# Patient Record
Sex: Male | Born: 1940 | Race: Black or African American | Marital: Married | Smoking: Never smoker
Health system: Southern US, Community
[De-identification: ages and names within clinical notes are randomized; demographics above are authoritative.]

## PROBLEM LIST (undated history)

## (undated) DIAGNOSIS — M109 Gout, unspecified: Secondary | ICD-10-CM

## (undated) DIAGNOSIS — I639 Cerebral infarction, unspecified: Secondary | ICD-10-CM

## (undated) DIAGNOSIS — Z87442 Personal history of urinary calculi: Secondary | ICD-10-CM

## (undated) DIAGNOSIS — Q614 Renal dysplasia: Secondary | ICD-10-CM

## (undated) DIAGNOSIS — N189 Chronic kidney disease, unspecified: Secondary | ICD-10-CM

## (undated) DIAGNOSIS — E119 Type 2 diabetes mellitus without complications: Secondary | ICD-10-CM

## (undated) DIAGNOSIS — I10 Essential (primary) hypertension: Secondary | ICD-10-CM

## (undated) HISTORY — PX: LITHOTRIPSY: SUR834

---

## 2016-01-14 ENCOUNTER — Other Ambulatory Visit: Payer: Self-pay | Admitting: Urology

## 2016-01-14 DIAGNOSIS — N281 Cyst of kidney, acquired: Secondary | ICD-10-CM

## 2016-01-22 ENCOUNTER — Ambulatory Visit
Admission: RE | Admit: 2016-01-22 | Discharge: 2016-01-22 | Disposition: A | Discharge status: Home / Self Care | Payer: Medicare PPO | Source: Ambulatory Visit | Attending: Urology | Admitting: Urology

## 2016-01-22 DIAGNOSIS — N281 Cyst of kidney, acquired: Secondary | ICD-10-CM

## 2016-01-22 MED ORDER — GADOBENATE DIMEGLUMINE 529 MG/ML IV SOLN
20.0000 mL | Freq: Once | INTRAVENOUS | Status: AC | PRN
  Administered 2016-01-22: 20 mL via INTRAVENOUS

## 2016-08-22 DIAGNOSIS — M1009 Idiopathic gout, multiple sites: Secondary | ICD-10-CM | POA: Diagnosis not present

## 2016-08-22 DIAGNOSIS — B9689 Other specified bacterial agents as the cause of diseases classified elsewhere: Secondary | ICD-10-CM | POA: Diagnosis not present

## 2016-08-22 DIAGNOSIS — R69 Illness, unspecified: Secondary | ICD-10-CM | POA: Diagnosis not present

## 2016-08-22 DIAGNOSIS — Z1389 Encounter for screening for other disorder: Secondary | ICD-10-CM | POA: Diagnosis not present

## 2016-08-22 DIAGNOSIS — N182 Chronic kidney disease, stage 2 (mild): Secondary | ICD-10-CM | POA: Diagnosis not present

## 2016-08-22 DIAGNOSIS — I10 Essential (primary) hypertension: Secondary | ICD-10-CM | POA: Diagnosis not present

## 2016-08-22 DIAGNOSIS — Z6834 Body mass index (BMI) 34.0-34.9, adult: Secondary | ICD-10-CM | POA: Diagnosis not present

## 2016-08-22 DIAGNOSIS — Z0000 Encounter for general adult medical examination without abnormal findings: Secondary | ICD-10-CM | POA: Diagnosis not present

## 2016-08-22 DIAGNOSIS — I6789 Other cerebrovascular disease: Secondary | ICD-10-CM | POA: Diagnosis not present

## 2016-08-22 DIAGNOSIS — N4289 Other specified disorders of prostate: Secondary | ICD-10-CM | POA: Diagnosis not present

## 2016-08-22 DIAGNOSIS — E784 Other hyperlipidemia: Secondary | ICD-10-CM | POA: Diagnosis not present

## 2016-09-06 DIAGNOSIS — R3915 Urgency of urination: Secondary | ICD-10-CM | POA: Diagnosis not present

## 2016-09-06 DIAGNOSIS — R351 Nocturia: Secondary | ICD-10-CM | POA: Diagnosis not present

## 2016-11-17 DIAGNOSIS — I10 Essential (primary) hypertension: Secondary | ICD-10-CM | POA: Diagnosis not present

## 2016-11-17 DIAGNOSIS — Z79899 Other long term (current) drug therapy: Secondary | ICD-10-CM | POA: Diagnosis not present

## 2016-11-17 DIAGNOSIS — M810 Age-related osteoporosis without current pathological fracture: Secondary | ICD-10-CM | POA: Diagnosis not present

## 2016-11-17 DIAGNOSIS — Z7982 Long term (current) use of aspirin: Secondary | ICD-10-CM | POA: Diagnosis not present

## 2016-11-17 DIAGNOSIS — M109 Gout, unspecified: Secondary | ICD-10-CM | POA: Diagnosis not present

## 2016-11-17 DIAGNOSIS — N400 Enlarged prostate without lower urinary tract symptoms: Secondary | ICD-10-CM | POA: Diagnosis not present

## 2016-11-17 DIAGNOSIS — Z13820 Encounter for screening for osteoporosis: Secondary | ICD-10-CM | POA: Diagnosis not present

## 2016-11-17 DIAGNOSIS — E7800 Pure hypercholesterolemia, unspecified: Secondary | ICD-10-CM | POA: Diagnosis not present

## 2016-11-29 DIAGNOSIS — N4289 Other specified disorders of prostate: Secondary | ICD-10-CM | POA: Diagnosis not present

## 2016-11-29 DIAGNOSIS — N182 Chronic kidney disease, stage 2 (mild): Secondary | ICD-10-CM | POA: Diagnosis not present

## 2016-11-29 DIAGNOSIS — I6789 Other cerebrovascular disease: Secondary | ICD-10-CM | POA: Diagnosis not present

## 2016-11-29 DIAGNOSIS — E784 Other hyperlipidemia: Secondary | ICD-10-CM | POA: Diagnosis not present

## 2016-11-29 DIAGNOSIS — Z6834 Body mass index (BMI) 34.0-34.9, adult: Secondary | ICD-10-CM | POA: Diagnosis not present

## 2016-11-29 DIAGNOSIS — I10 Essential (primary) hypertension: Secondary | ICD-10-CM | POA: Diagnosis not present

## 2016-11-29 DIAGNOSIS — Z125 Encounter for screening for malignant neoplasm of prostate: Secondary | ICD-10-CM | POA: Diagnosis not present

## 2016-11-29 DIAGNOSIS — M1009 Idiopathic gout, multiple sites: Secondary | ICD-10-CM | POA: Diagnosis not present

## 2017-01-18 ENCOUNTER — Ambulatory Visit: Payer: Medicare PPO | Admitting: Urology

## 2017-03-07 DIAGNOSIS — I6789 Other cerebrovascular disease: Secondary | ICD-10-CM | POA: Diagnosis not present

## 2017-03-07 DIAGNOSIS — N182 Chronic kidney disease, stage 2 (mild): Secondary | ICD-10-CM | POA: Diagnosis not present

## 2017-03-07 DIAGNOSIS — I10 Essential (primary) hypertension: Secondary | ICD-10-CM | POA: Diagnosis not present

## 2017-03-07 DIAGNOSIS — M1009 Idiopathic gout, multiple sites: Secondary | ICD-10-CM | POA: Diagnosis not present

## 2017-03-07 DIAGNOSIS — Z6833 Body mass index (BMI) 33.0-33.9, adult: Secondary | ICD-10-CM | POA: Diagnosis not present

## 2017-03-07 DIAGNOSIS — E1165 Type 2 diabetes mellitus with hyperglycemia: Secondary | ICD-10-CM | POA: Diagnosis not present

## 2017-03-07 DIAGNOSIS — E784 Other hyperlipidemia: Secondary | ICD-10-CM | POA: Diagnosis not present

## 2017-05-03 ENCOUNTER — Ambulatory Visit: Payer: Medicare PPO | Admitting: Urology

## 2017-05-30 DIAGNOSIS — H02403 Unspecified ptosis of bilateral eyelids: Secondary | ICD-10-CM | POA: Diagnosis not present

## 2017-05-30 DIAGNOSIS — H35032 Hypertensive retinopathy, left eye: Secondary | ICD-10-CM | POA: Diagnosis not present

## 2017-05-30 DIAGNOSIS — H524 Presbyopia: Secondary | ICD-10-CM | POA: Diagnosis not present

## 2017-05-30 DIAGNOSIS — H5203 Hypermetropia, bilateral: Secondary | ICD-10-CM | POA: Diagnosis not present

## 2017-05-30 DIAGNOSIS — E119 Type 2 diabetes mellitus without complications: Secondary | ICD-10-CM | POA: Diagnosis not present

## 2017-05-30 DIAGNOSIS — H52222 Regular astigmatism, left eye: Secondary | ICD-10-CM | POA: Diagnosis not present

## 2017-05-30 DIAGNOSIS — I10 Essential (primary) hypertension: Secondary | ICD-10-CM | POA: Diagnosis not present

## 2017-05-30 DIAGNOSIS — H11153 Pinguecula, bilateral: Secondary | ICD-10-CM | POA: Diagnosis not present

## 2017-05-30 DIAGNOSIS — H18413 Arcus senilis, bilateral: Secondary | ICD-10-CM | POA: Diagnosis not present

## 2017-05-30 DIAGNOSIS — H35031 Hypertensive retinopathy, right eye: Secondary | ICD-10-CM | POA: Diagnosis not present

## 2017-06-08 DIAGNOSIS — I6789 Other cerebrovascular disease: Secondary | ICD-10-CM | POA: Diagnosis not present

## 2017-06-08 DIAGNOSIS — E7849 Other hyperlipidemia: Secondary | ICD-10-CM | POA: Diagnosis not present

## 2017-06-08 DIAGNOSIS — N182 Chronic kidney disease, stage 2 (mild): Secondary | ICD-10-CM | POA: Diagnosis not present

## 2017-06-08 DIAGNOSIS — Z6833 Body mass index (BMI) 33.0-33.9, adult: Secondary | ICD-10-CM | POA: Diagnosis not present

## 2017-06-08 DIAGNOSIS — M1009 Idiopathic gout, multiple sites: Secondary | ICD-10-CM | POA: Diagnosis not present

## 2017-06-08 DIAGNOSIS — E1165 Type 2 diabetes mellitus with hyperglycemia: Secondary | ICD-10-CM | POA: Diagnosis not present

## 2017-06-08 DIAGNOSIS — I10 Essential (primary) hypertension: Secondary | ICD-10-CM | POA: Diagnosis not present

## 2017-06-08 DIAGNOSIS — R69 Illness, unspecified: Secondary | ICD-10-CM | POA: Diagnosis not present

## 2017-07-19 DIAGNOSIS — I679 Cerebrovascular disease, unspecified: Secondary | ICD-10-CM | POA: Diagnosis not present

## 2017-07-19 DIAGNOSIS — G4459 Other complicated headache syndrome: Secondary | ICD-10-CM | POA: Diagnosis not present

## 2017-07-19 DIAGNOSIS — I10 Essential (primary) hypertension: Secondary | ICD-10-CM | POA: Diagnosis not present

## 2017-07-19 DIAGNOSIS — M542 Cervicalgia: Secondary | ICD-10-CM | POA: Diagnosis not present

## 2017-07-20 ENCOUNTER — Other Ambulatory Visit (HOSPITAL_COMMUNITY): Payer: Self-pay | Admitting: Neurology

## 2017-07-20 ENCOUNTER — Ambulatory Visit (HOSPITAL_COMMUNITY)
Admission: RE | Admit: 2017-07-20 | Discharge: 2017-07-20 | Disposition: A | Discharge status: Home / Self Care | Payer: Medicare HMO | Source: Ambulatory Visit | Attending: Neurology | Admitting: Neurology

## 2017-07-20 DIAGNOSIS — M542 Cervicalgia: Secondary | ICD-10-CM

## 2017-07-20 DIAGNOSIS — M5030 Other cervical disc degeneration, unspecified cervical region: Secondary | ICD-10-CM | POA: Insufficient documentation

## 2017-08-10 DIAGNOSIS — K5730 Diverticulosis of large intestine without perforation or abscess without bleeding: Secondary | ICD-10-CM | POA: Diagnosis not present

## 2017-08-10 DIAGNOSIS — Z86010 Personal history of colonic polyps: Secondary | ICD-10-CM | POA: Diagnosis not present

## 2017-08-10 DIAGNOSIS — I10 Essential (primary) hypertension: Secondary | ICD-10-CM | POA: Diagnosis not present

## 2017-08-10 DIAGNOSIS — Z8673 Personal history of transient ischemic attack (TIA), and cerebral infarction without residual deficits: Secondary | ICD-10-CM | POA: Diagnosis not present

## 2017-08-10 DIAGNOSIS — E119 Type 2 diabetes mellitus without complications: Secondary | ICD-10-CM | POA: Diagnosis not present

## 2017-08-10 DIAGNOSIS — Z1211 Encounter for screening for malignant neoplasm of colon: Secondary | ICD-10-CM | POA: Diagnosis not present

## 2017-09-19 DIAGNOSIS — E7849 Other hyperlipidemia: Secondary | ICD-10-CM | POA: Diagnosis not present

## 2017-09-19 DIAGNOSIS — E119 Type 2 diabetes mellitus without complications: Secondary | ICD-10-CM | POA: Diagnosis not present

## 2017-09-19 DIAGNOSIS — N182 Chronic kidney disease, stage 2 (mild): Secondary | ICD-10-CM | POA: Diagnosis not present

## 2017-09-19 DIAGNOSIS — Z6833 Body mass index (BMI) 33.0-33.9, adult: Secondary | ICD-10-CM | POA: Diagnosis not present

## 2017-09-19 DIAGNOSIS — I6789 Other cerebrovascular disease: Secondary | ICD-10-CM | POA: Diagnosis not present

## 2017-09-19 DIAGNOSIS — I10 Essential (primary) hypertension: Secondary | ICD-10-CM | POA: Diagnosis not present

## 2017-09-19 DIAGNOSIS — M1009 Idiopathic gout, multiple sites: Secondary | ICD-10-CM | POA: Diagnosis not present

## 2017-09-27 DIAGNOSIS — R69 Illness, unspecified: Secondary | ICD-10-CM | POA: Diagnosis not present

## 2017-09-27 DIAGNOSIS — R32 Unspecified urinary incontinence: Secondary | ICD-10-CM | POA: Diagnosis not present

## 2017-09-27 DIAGNOSIS — N400 Enlarged prostate without lower urinary tract symptoms: Secondary | ICD-10-CM | POA: Diagnosis not present

## 2017-09-27 DIAGNOSIS — I10 Essential (primary) hypertension: Secondary | ICD-10-CM | POA: Diagnosis not present

## 2017-09-27 DIAGNOSIS — E785 Hyperlipidemia, unspecified: Secondary | ICD-10-CM | POA: Diagnosis not present

## 2017-09-27 DIAGNOSIS — E669 Obesity, unspecified: Secondary | ICD-10-CM | POA: Diagnosis not present

## 2017-09-27 DIAGNOSIS — Z6834 Body mass index (BMI) 34.0-34.9, adult: Secondary | ICD-10-CM | POA: Diagnosis not present

## 2017-09-27 DIAGNOSIS — N529 Male erectile dysfunction, unspecified: Secondary | ICD-10-CM | POA: Diagnosis not present

## 2017-09-27 DIAGNOSIS — E119 Type 2 diabetes mellitus without complications: Secondary | ICD-10-CM | POA: Diagnosis not present

## 2017-09-27 DIAGNOSIS — R52 Pain, unspecified: Secondary | ICD-10-CM | POA: Diagnosis not present

## 2017-12-05 ENCOUNTER — Ambulatory Visit: Payer: Medicare HMO | Admitting: Urology

## 2017-12-05 DIAGNOSIS — N401 Enlarged prostate with lower urinary tract symptoms: Secondary | ICD-10-CM

## 2017-12-05 DIAGNOSIS — R351 Nocturia: Secondary | ICD-10-CM | POA: Diagnosis not present

## 2017-12-05 DIAGNOSIS — R3915 Urgency of urination: Secondary | ICD-10-CM

## 2017-12-13 ENCOUNTER — Other Ambulatory Visit: Payer: Self-pay | Admitting: Urology

## 2017-12-14 ENCOUNTER — Other Ambulatory Visit: Payer: Self-pay | Admitting: Urology

## 2017-12-19 ENCOUNTER — Other Ambulatory Visit: Payer: Self-pay | Admitting: Urology

## 2017-12-20 ENCOUNTER — Other Ambulatory Visit: Payer: Self-pay | Admitting: Urology

## 2017-12-20 DIAGNOSIS — N138 Other obstructive and reflux uropathy: Secondary | ICD-10-CM

## 2017-12-20 DIAGNOSIS — N401 Enlarged prostate with lower urinary tract symptoms: Principal | ICD-10-CM

## 2017-12-21 ENCOUNTER — Other Ambulatory Visit: Payer: Self-pay | Admitting: Urology

## 2017-12-21 DIAGNOSIS — N138 Other obstructive and reflux uropathy: Secondary | ICD-10-CM

## 2017-12-21 DIAGNOSIS — N401 Enlarged prostate with lower urinary tract symptoms: Principal | ICD-10-CM

## 2017-12-25 DIAGNOSIS — I6789 Other cerebrovascular disease: Secondary | ICD-10-CM | POA: Diagnosis not present

## 2017-12-25 DIAGNOSIS — E7849 Other hyperlipidemia: Secondary | ICD-10-CM | POA: Diagnosis not present

## 2017-12-25 DIAGNOSIS — N182 Chronic kidney disease, stage 2 (mild): Secondary | ICD-10-CM | POA: Diagnosis not present

## 2017-12-25 DIAGNOSIS — M1009 Idiopathic gout, multiple sites: Secondary | ICD-10-CM | POA: Diagnosis not present

## 2017-12-25 DIAGNOSIS — I10 Essential (primary) hypertension: Secondary | ICD-10-CM | POA: Diagnosis not present

## 2017-12-25 DIAGNOSIS — Z6833 Body mass index (BMI) 33.0-33.9, adult: Secondary | ICD-10-CM | POA: Diagnosis not present

## 2017-12-25 DIAGNOSIS — Z6832 Body mass index (BMI) 32.0-32.9, adult: Secondary | ICD-10-CM | POA: Diagnosis not present

## 2017-12-25 DIAGNOSIS — E119 Type 2 diabetes mellitus without complications: Secondary | ICD-10-CM | POA: Diagnosis not present

## 2017-12-29 ENCOUNTER — Ambulatory Visit (HOSPITAL_COMMUNITY)
Admission: RE | Admit: 2017-12-29 | Discharge: 2017-12-29 | Disposition: A | Discharge status: Home / Self Care | Payer: Medicare HMO | Source: Ambulatory Visit | Attending: Urology | Admitting: Urology

## 2017-12-29 DIAGNOSIS — N401 Enlarged prostate with lower urinary tract symptoms: Secondary | ICD-10-CM | POA: Diagnosis not present

## 2017-12-29 DIAGNOSIS — N138 Other obstructive and reflux uropathy: Secondary | ICD-10-CM

## 2018-01-09 ENCOUNTER — Ambulatory Visit: Payer: Medicare HMO | Admitting: Urology

## 2018-01-09 DIAGNOSIS — R351 Nocturia: Secondary | ICD-10-CM | POA: Diagnosis not present

## 2018-01-09 DIAGNOSIS — N401 Enlarged prostate with lower urinary tract symptoms: Secondary | ICD-10-CM | POA: Diagnosis not present

## 2018-01-15 ENCOUNTER — Other Ambulatory Visit: Payer: Self-pay | Admitting: Urology

## 2018-02-06 NOTE — Patient Instructions (Addendum)
Barry Carpenter  02/06/2018     @PREFPERIOPPHARMACY @   Your procedure is scheduled on  02/15/2018   Report to [facility] at  615   A.M.  Call this number if you have problems the morning of surgery:  [phone]   Remember:  Do not eat or drink after midnight.  You may drink clear liquids until  12 midnight 02/14/2018 .  Clear liquids allowed are:                    Water, Juice (non-citric and without pulp), Carbonated beverages, Clear Tea, Black Coffee only, Plain Jell-O only, Gatorade and Plain Popsicles only    Take these medicines the morning of surgery with A SIP OF WATER Amlodipine, Catapress, Proscar, Lisinopril, Ditropan, Flomax  Do not wear jewelry, make-up or nail polish.  Do not wear lotions, powders, or perfumes, or deodorant.  Do not shave 48 hours prior to surgery.  Men may shave face and neck.  Do not bring valuables to the hospital.  [facility] is not responsible for any belongings or valuables.  Contacts, dentures or bridgework may not be worn into surgery.  Leave your suitcase in the car.  After surgery it may be brought to your room.  For patients admitted to the hospital, discharge time will be determined by your treatment team.  Patients discharged the day of surgery will not be allowed to drive home.   Name and phone number of your driver:   family Special instructions:  None  Please read over the following fact sheets that you were given. Anesthesia Post-op Instructions and Care and Recovery After Surgery       Cystoscopy Cystoscopy is a procedure that is used to help diagnose and sometimes treat conditions that affect that lower urinary tract. The lower urinary tract includes the bladder and the tube that drains urine from the bladder out of the body (urethra). Cystoscopy is performed with a thin, tube-shaped instrument with a light and camera at the end (cystoscope). The cystoscope may be hard (rigid) or flexible, depending on the goal of  the procedure.The cystoscope is inserted through the urethra, into the bladder. Cystoscopy may be recommended if you have:  Urinary tractinfections that keep coming back (recurring).  Blood in the urine (hematuria).  Loss of bladder control (urinary incontinence) or an overactive bladder.  Unusual cells found in a urine sample.  A blockage in the urethra.  Painful urination.  An abnormality in the bladder found during an intravenous pyelogram (IVP) or CT scan.  Cystoscopy may also be done to remove a sample of tissue to be examined under a microscope (biopsy). Tell a health care provider about:  Any allergies you have.  All medicines you are taking, including vitamins, herbs, eye drops, creams, and over-the-counter medicines.  Any problems you or family members have had with anesthetic medicines.  Any blood disorders you have.  Any surgeries you have had.  Any medical conditions you have.  Whether you are pregnant or may be pregnant. What are the risks? Generally, this is a safe procedure. However, problems may occur, including:  Infection.  Bleeding.  Allergic reactions to medicines.  Damage to other structures or organs.  What happens before the procedure?  Ask your health care provider about: ? Changing or stopping your regular medicines. This is especially important if you are taking diabetes medicines or blood thinners. ? Taking medicines such as aspirin  and ibuprofen. These medicines can thin your blood. Do not take these medicines before your procedure if your health care provider instructs you not to.  Follow instructions from your health care provider about eating or drinking restrictions.  You may be given antibiotic medicine to help prevent infection.  You may have an exam or testing, such as X-rays of the bladder, urethra, or kidneys.  You may have urine tests to check for signs of infection.  Plan to have someone take you home after the  procedure. What happens during the procedure?  To reduce your risk of infection,your health care team will wash or sanitize their hands.  You will be given one or more of the following: ? A medicine to help you relax (sedative). ? A medicine to numb the area (local anesthetic).  The area around the opening of your urethra will be cleaned.  The cystoscope will be passed through your urethra into your bladder.  Germ-free (sterile)fluid will flow through the cystoscope to fill your bladder. The fluid will stretch your bladder so that your surgeon can clearly examine your bladder walls.  The cystoscope will be removed and your bladder will be emptied. The procedure may vary among health care providers and hospitals. What happens after the procedure?  You may have some soreness or pain in your abdomen and urethra. Medicines will be available to help you.  You may have some blood in your urine.  Do not drive for 24 hours if you received a sedative. This information is not intended to replace advice given to you by your health care provider. Make sure you discuss any questions you have with your health care provider. Document Released: 07/22/2000 Document Revised: 12/03/2015 Document Reviewed: 06/11/2015 Elsevier Interactive Patient Education  2018 [employer].  Cystoscopy, Care After Refer to this sheet in the next few weeks. These instructions provide you with information about caring for yourself after your procedure. Your health care provider may also give you more specific instructions. Your treatment has been planned according to current medical practices, but problems sometimes occur. Call your health care provider if you have any problems or questions after your procedure. What can I expect after the procedure? After the procedure, it is common to have:  Mild pain when you urinate. Pain should stop within a few minutes after you urinate. This may last for up to 1 week.  A  small amount of blood in your urine for several days.  Feeling like you need to urinate but producing only a small amount of urine.  Follow these instructions at home:  Medicines  Take over-the-counter and prescription medicines only as told by your health care provider.  If you were prescribed an antibiotic medicine, take it as told by your health care provider. Do not stop taking the antibiotic even if you start to feel better. General instructions   Return to your normal activities as told by your health care provider. Ask your health care provider what activities are safe for you.  Do not drive for 24 hours if you received a sedative.  Watch for any blood in your urine. If the amount of blood in your urine increases, call your health care provider.  Follow instructions from your health care provider about eating or drinking restrictions.  If a tissue sample was removed for testing (biopsy) during your procedure, it is your responsibility to get your test results. Ask your health care provider or the department performing the test when your results  will be ready.  Drink enough fluid to keep your urine clear or pale yellow.  Keep all follow-up visits as told by your health care provider. This is important. Contact a health care provider if:  You have pain that gets worse or does not get better with medicine, especially pain when you urinate.  You have difficulty urinating. Get help right away if:  You have more blood in your urine.  You have blood clots in your urine.  You have abdominal pain.  You have a fever or chills.  You are unable to urinate. This information is not intended to replace advice given to you by your health care provider. Make sure you discuss any questions you have with your health care provider. Document Released: 02/11/2005 Document Revised: 12/31/2015 Document Reviewed: 06/11/2015 Elsevier Interactive Patient Education  2018 [employer]  Monitored Anesthesia Care Anesthesia is a term that refers to techniques, procedures, and medicines that help a person stay safe and comfortable during a medical procedure. Monitored anesthesia care, or sedation, is one type of anesthesia. Your anesthesia specialist may recommend sedation if you will be having a procedure that does not require you to be unconscious, such as:  Cataract surgery.  A dental procedure.  A biopsy.  A colonoscopy.  During the procedure, you may receive a medicine to help you relax (sedative). There are three levels of sedation:  Mild sedation. At this level, you may feel awake and relaxed. You will be able to follow directions.  Moderate sedation. At this level, you will be sleepy. You may not remember the procedure.  Deep sedation. At this level, you will be asleep. You will not remember the procedure.  The more medicine you are given, the deeper your level of sedation will be. Depending on how you respond to the procedure, the anesthesia specialist may change your level of sedation or the type of anesthesia to fit your needs. An anesthesia specialist will monitor you closely during the procedure. Let your health care provider know about:  Any allergies you have.  All medicines you are taking, including vitamins, herbs, eye drops, creams, and over-the-counter medicines.  Any use of steroids (by mouth or as a cream).  Any problems you or family members have had with sedatives and anesthetic medicines.  Any blood disorders you have.  Any surgeries you have had.  Any medical conditions you have, such as sleep apnea.  Whether you are pregnant or may be pregnant.  Any use of cigarettes, alcohol, or street drugs. What are the risks? Generally, this is a safe procedure. However, problems may occur, including:  Getting too much medicine (oversedation).  Nausea.  Allergic reaction to medicines.  Trouble breathing. If this happens, a breathing  tube may be used to help with breathing. It will be removed when you are awake and breathing on your own.  Heart trouble.  Lung trouble.  Before the procedure Staying hydrated Follow instructions from your health care provider about hydration, which may include:  Up to 2 hours before the procedure - you may continue to drink clear liquids, such as water, clear fruit juice, black coffee, and plain tea.  Eating and drinking restrictions Follow instructions from your health care provider about eating and drinking, which may include:  8 hours before the procedure - stop eating heavy meals or foods such as meat, fried foods, or fatty foods.  6 hours before the procedure - stop eating light meals or foods, such as toast or cereal.  6 hours before the procedure - stop drinking milk or drinks that contain milk.  2 hours before the procedure - stop drinking clear liquids.  Medicines Ask your health care provider about:  Changing or stopping your regular medicines. This is especially important if you are taking diabetes medicines or blood thinners.  Taking medicines such as aspirin and ibuprofen. These medicines can thin your blood. Do not take these medicines before your procedure if your health care provider instructs you not to.  Tests and exams  You will have a physical exam.  You may have blood tests done to show: ? How well your kidneys and liver are working. ? How well your blood can clot.  General instructions  Plan to have someone take you home from the hospital or clinic.  If you will be going home right after the procedure, plan to have someone with you for 24 hours.  What happens during the procedure?  Your blood pressure, heart rate, breathing, level of pain and overall condition will be monitored.  An IV tube will be inserted into one of your veins.  Your anesthesia specialist will give you medicines as needed to keep you comfortable during the procedure. This  may mean changing the level of sedation.  The procedure will be performed. After the procedure  Your blood pressure, heart rate, breathing rate, and blood oxygen level will be monitored until the medicines you were given have worn off.  Do not drive for 24 hours if you received a sedative.  You may: ? Feel sleepy, clumsy, or nauseous. ? Feel forgetful about what happened after the procedure. ? Have a sore throat if you had a breathing tube during the procedure. ? Vomit. This information is not intended to replace advice given to you by your health care provider. Make sure you discuss any questions you have with your health care provider. Document Released: 04/20/2005 Document Revised: 01/01/2016 Document Reviewed: 11/15/2015 Elsevier Interactive Patient Education  2018 [employer] Inc[employer] Monitored Anesthesia Care, Care After These instructions provide you with information about caring for yourself after your procedure. Your health care provider may also give you more specific instructions. Your treatment has been planned according to current medical practices, but problems sometimes occur. Call your health care provider if you have any problems or questions after your procedure. What can I expect after the procedure? After your procedure, it is common to:  Feel sleepy for several hours.  Feel clumsy and have poor balance for several hours.  Feel forgetful about what happened after the procedure.  Have poor judgment for several hours.  Feel nauseous or vomit.  Have a sore throat if you had a breathing tube during the procedure.  Follow these instructions at home: For at least 24 hours after the procedure:   Do not: ? Participate in activities in which you could fall or become injured. ? Drive. ? Use heavy machinery. ? Drink alcohol. ? Take sleeping pills or medicines that cause drowsiness. ? Make important decisions or sign legal documents. ? Take care of children on your  own.  Rest. Eating and drinking  Follow the diet that is recommended by your health care provider.  If you vomit, drink water, juice, or soup when you can drink without vomiting.  Make sure you have little or no nausea before eating solid foods. General instructions  Have a responsible adult stay with you until you are awake and alert.  Take over-the-counter and prescription medicines only as told by  your health care provider.  If you smoke, do not smoke without supervision.  Keep all follow-up visits as told by your health care provider. This is important. Contact a health care provider if:  You keep feeling nauseous or you keep vomiting.  You feel light-headed.  You develop a rash.  You have a fever. Get help right away if:  You have trouble breathing. This information is not intended to replace advice given to you by your health care provider. Make sure you discuss any questions you have with your health care provider. Document Released: 11/15/2015 Document Revised: 03/16/2016 Document Reviewed: 11/15/2015 Elsevier Interactive Patient Education  [employer].

## 2018-02-12 ENCOUNTER — Encounter (HOSPITAL_COMMUNITY)
Admission: RE | Admit: 2018-02-12 | Discharge: 2018-02-12 | Disposition: A | Discharge status: Home / Self Care | Payer: Medicare HMO | Source: Ambulatory Visit | Attending: Urology | Admitting: Urology

## 2018-02-12 ENCOUNTER — Other Ambulatory Visit: Payer: Self-pay

## 2018-02-12 ENCOUNTER — Encounter (HOSPITAL_COMMUNITY): Payer: Self-pay

## 2018-02-12 DIAGNOSIS — Q614 Renal dysplasia: Secondary | ICD-10-CM | POA: Diagnosis not present

## 2018-02-12 DIAGNOSIS — Z01812 Encounter for preprocedural laboratory examination: Secondary | ICD-10-CM

## 2018-02-12 DIAGNOSIS — N138 Other obstructive and reflux uropathy: Secondary | ICD-10-CM | POA: Diagnosis not present

## 2018-02-12 DIAGNOSIS — I10 Essential (primary) hypertension: Secondary | ICD-10-CM | POA: Diagnosis not present

## 2018-02-12 DIAGNOSIS — Z01810 Encounter for preprocedural cardiovascular examination: Secondary | ICD-10-CM

## 2018-02-12 DIAGNOSIS — M109 Gout, unspecified: Secondary | ICD-10-CM | POA: Diagnosis not present

## 2018-02-12 DIAGNOSIS — N401 Enlarged prostate with lower urinary tract symptoms: Secondary | ICD-10-CM | POA: Diagnosis not present

## 2018-02-12 DIAGNOSIS — Z87442 Personal history of urinary calculi: Secondary | ICD-10-CM | POA: Diagnosis not present

## 2018-02-12 DIAGNOSIS — E119 Type 2 diabetes mellitus without complications: Secondary | ICD-10-CM | POA: Diagnosis not present

## 2018-02-12 HISTORY — DX: Essential (primary) hypertension: I10

## 2018-02-12 HISTORY — DX: Type 2 diabetes mellitus without complications: E11.9

## 2018-02-12 HISTORY — DX: Renal dysplasia: Q61.4

## 2018-02-12 HISTORY — DX: Gout, unspecified: M10.9

## 2018-02-12 HISTORY — DX: Personal history of urinary calculi: Z87.442

## 2018-02-12 LAB — CBC WITH DIFFERENTIAL/PLATELET
BASOS PCT: 0 %
Basophils Absolute: 0 10*3/uL (ref 0.0–0.1)
Eosinophils Absolute: 0.1 10*3/uL (ref 0.0–0.7)
Eosinophils Relative: 2 %
HEMATOCRIT: 46.4 % (ref 39.0–52.0)
Hemoglobin: 16 g/dL (ref 13.0–17.0)
Lymphocytes Relative: 23 %
Lymphs Abs: 1.2 10*3/uL (ref 0.7–4.0)
MCH: 33.5 pg (ref 26.0–34.0)
MCHC: 34.5 g/dL (ref 30.0–36.0)
MCV: 97.1 fL (ref 78.0–100.0)
MONO ABS: 0.5 10*3/uL (ref 0.1–1.0)
MONOS PCT: 9 %
NEUTROS ABS: 3.5 10*3/uL (ref 1.7–7.7)
Neutrophils Relative %: 66 %
Platelets: 148 10*3/uL — ABNORMAL LOW (ref 150–400)
RBC: 4.78 MIL/uL (ref 4.22–5.81)
RDW: 14.5 % (ref 11.5–15.5)
WBC: 5.3 10*3/uL (ref 4.0–10.5)

## 2018-02-12 LAB — BASIC METABOLIC PANEL
Anion gap: 10 (ref 5–15)
BUN: 35 mg/dL — ABNORMAL HIGH (ref 8–23)
CALCIUM: 9.4 mg/dL (ref 8.9–10.3)
CO2: 22 mmol/L (ref 22–32)
CREATININE: 1.73 mg/dL — AB (ref 0.61–1.24)
Chloride: 106 mmol/L (ref 98–111)
GFR calc Af Amer: 43 mL/min — ABNORMAL LOW (ref >60)
GFR calc non Af Amer: 37 mL/min — ABNORMAL LOW (ref >60)
GLUCOSE: 105 mg/dL — AB (ref 70–99)
Potassium: 3.7 mmol/L (ref 3.5–5.1)
Sodium: 138 mmol/L (ref 135–145)

## 2018-02-14 NOTE — H&P (Signed)
H&P  Chief Complaint: Difficulty urinating  History of Present Illness: 77 year old male presents for Urolift procedure for mgmt of voiding dysfunction secondary to BPH.  Past Medical History:  Diagnosis Date  . Cystic dysplasia of one kidney   . Diabetes mellitus without complication (HCC)   . Gout   . History of kidney stones   . Hypertension     Past Surgical History:  Procedure Laterality Date  . LITHOTRIPSY      Home Medications:  Allergies as of 02/14/2018   No Known Allergies     Medication List    Notice   Cannot display discharge medications because the patient has not yet been admitted.     Allergies: No Known Allergies  No family history on file.  Social History:  reports that he has never smoked. He has never used smokeless tobacco. He reports that he drank alcohol. He reports that he does not use drugs.  ROS: A complete review of systems was performed.  All systems are negative except for pertinent findings as noted.  Physical Exam:  Vital signs in last 24 hours:   Constitutional:  Alert and oriented, No acute distress Cardiovascular: Regular rate and rhythm, No JVD Respiratory: Normal respiratory effort, Lungs clear bilaterally GI: Abdomen is soft, nontender, nondistended, no abdominal masses Genitourinary: No CVAT. Normal male phallus, testes are descended bilaterally and non-tender and without masses, scrotum is normal in appearance without lesions or masses, perineum is normal on inspection. Rectal: Normal sphincter tone, no rectal masses, prostate is non tender and without nodularity. Prostate size is estimated to be 30 cc Lymphatic: No lymphadenopathy Neurologic: Grossly intact, no focal deficits Psychiatric: Normal mood and affect  Laboratory Data:  Recent Labs    02/12/18 1308  WBC 5.3  HGB 16.0  HCT 46.4  PLT 148*    Recent Labs    02/12/18 1308  NA 138  K 3.7  CL 106  GLUCOSE 105*  BUN 35*  CALCIUM 9.4  CREATININE 1.73*      No results found for this or any previous visit (from the past 24 hour(s)). No results found for this or any previous visit (from the past 240 hour(s)).  Renal Function: Recent Labs    02/12/18 1308  CREATININE 1.73*   Estimated Creatinine Clearance: 43.2 mL/min (A) (by C-G formula based on SCr of 1.73 mg/dL (H)).  Radiologic Imaging: No results found.  Impression/Assessment:  BPH with significant voiding issues  Plan:  Urolift procedure

## 2018-02-15 ENCOUNTER — Ambulatory Visit (HOSPITAL_COMMUNITY): Payer: Medicare HMO | Admitting: Anesthesiology

## 2018-02-15 ENCOUNTER — Encounter (HOSPITAL_COMMUNITY)
Admission: RE | Disposition: A | Discharge status: Home / Self Care | Payer: Self-pay | Source: Ambulatory Visit | Attending: Urology

## 2018-02-15 ENCOUNTER — Ambulatory Visit (HOSPITAL_COMMUNITY): Payer: Medicare HMO

## 2018-02-15 ENCOUNTER — Ambulatory Visit (HOSPITAL_COMMUNITY)
Admission: RE | Admit: 2018-02-15 | Discharge: 2018-02-15 | Disposition: A | Discharge status: Home / Self Care | Payer: Medicare HMO | Source: Ambulatory Visit | Attending: Urology | Admitting: Urology

## 2018-02-15 ENCOUNTER — Encounter (HOSPITAL_COMMUNITY): Payer: Self-pay | Admitting: *Deleted

## 2018-02-15 ENCOUNTER — Other Ambulatory Visit: Payer: Self-pay

## 2018-02-15 DIAGNOSIS — N401 Enlarged prostate with lower urinary tract symptoms: Secondary | ICD-10-CM | POA: Insufficient documentation

## 2018-02-15 DIAGNOSIS — Q614 Renal dysplasia: Secondary | ICD-10-CM | POA: Diagnosis not present

## 2018-02-15 DIAGNOSIS — N138 Other obstructive and reflux uropathy: Secondary | ICD-10-CM | POA: Insufficient documentation

## 2018-02-15 DIAGNOSIS — Z8546 Personal history of malignant neoplasm of prostate: Secondary | ICD-10-CM

## 2018-02-15 DIAGNOSIS — Z87442 Personal history of urinary calculi: Secondary | ICD-10-CM | POA: Diagnosis not present

## 2018-02-15 DIAGNOSIS — N400 Enlarged prostate without lower urinary tract symptoms: Secondary | ICD-10-CM | POA: Diagnosis not present

## 2018-02-15 DIAGNOSIS — M109 Gout, unspecified: Secondary | ICD-10-CM | POA: Diagnosis not present

## 2018-02-15 DIAGNOSIS — I10 Essential (primary) hypertension: Secondary | ICD-10-CM | POA: Insufficient documentation

## 2018-02-15 DIAGNOSIS — E119 Type 2 diabetes mellitus without complications: Secondary | ICD-10-CM | POA: Diagnosis not present

## 2018-02-15 HISTORY — PX: CYSTOSCOPY WITH INSERTION OF UROLIFT: SHX6678

## 2018-02-15 LAB — GLUCOSE, CAPILLARY
Glucose-Capillary: 110 mg/dL — ABNORMAL HIGH (ref 70–99)
Glucose-Capillary: 125 mg/dL — ABNORMAL HIGH (ref 70–99)

## 2018-02-15 SURGERY — CYSTOSCOPY WITH INSERTION OF UROLIFT
Anesthesia: General

## 2018-02-15 MED ORDER — LACTATED RINGERS IV SOLN: INTRAVENOUS | Status: DC

## 2018-02-15 MED ORDER — LACTATED RINGERS IV SOLN
INTRAVENOUS | Status: DC
  Administered 2018-02-15: 07:00:00 via INTRAVENOUS

## 2018-02-15 MED ORDER — SUCCINYLCHOLINE CHLORIDE 20 MG/ML IJ SOLN
INTRAMUSCULAR | Status: AC
  Filled 2018-02-15: qty 1

## 2018-02-15 MED ORDER — HYDROMORPHONE HCL 1 MG/ML IJ SOLN: 0.2500 mg | INTRAMUSCULAR | Status: DC | PRN

## 2018-02-15 MED ORDER — LIDOCAINE HCL (PF) 1 % IJ SOLN
INTRAMUSCULAR | Status: AC
  Filled 2018-02-15: qty 5

## 2018-02-15 MED ORDER — FENTANYL CITRATE (PF) 100 MCG/2ML IJ SOLN
INTRAMUSCULAR | Status: DC | PRN
  Administered 2018-02-15 (×2): 25 ug via INTRAVENOUS
  Administered 2018-02-15: 50 ug via INTRAVENOUS

## 2018-02-15 MED ORDER — ONDANSETRON HCL 4 MG/2ML IJ SOLN
INTRAMUSCULAR | Status: DC | PRN
  Administered 2018-02-15: 4 mg via INTRAVENOUS

## 2018-02-15 MED ORDER — SODIUM CHLORIDE 0.9 % IJ SOLN
INTRAMUSCULAR | Status: AC
  Filled 2018-02-15: qty 10

## 2018-02-15 MED ORDER — TRAMADOL HCL 50 MG PO TABS: 50.0000 mg | ORAL_TABLET | Freq: Four times a day (QID) | ORAL | 0 refills | Status: AC | PRN

## 2018-02-15 MED ORDER — ONDANSETRON HCL 4 MG/2ML IJ SOLN
INTRAMUSCULAR | Status: AC
  Filled 2018-02-15: qty 2

## 2018-02-15 MED ORDER — FENTANYL CITRATE (PF) 100 MCG/2ML IJ SOLN
INTRAMUSCULAR | Status: AC
  Filled 2018-02-15: qty 2

## 2018-02-15 MED ORDER — EPHEDRINE SULFATE 50 MG/ML IJ SOLN
INTRAMUSCULAR | Status: AC
  Filled 2018-02-15: qty 1

## 2018-02-15 MED ORDER — LIDOCAINE HCL URETHRAL/MUCOSAL 2 % EX GEL
CUTANEOUS | Status: AC
  Filled 2018-02-15: qty 10

## 2018-02-15 MED ORDER — PROPOFOL 10 MG/ML IV BOLUS
INTRAVENOUS | Status: AC
  Filled 2018-02-15: qty 40

## 2018-02-15 MED ORDER — LIDOCAINE HCL (CARDIAC) PF 100 MG/5ML IV SOSY
PREFILLED_SYRINGE | INTRAVENOUS | Status: DC | PRN
  Administered 2018-02-15: 30 mg via INTRAVENOUS

## 2018-02-15 MED ORDER — HYDROCODONE-ACETAMINOPHEN 7.5-325 MG PO TABS: 1.0000 | ORAL_TABLET | Freq: Once | ORAL | Status: DC | PRN

## 2018-02-15 MED ORDER — CEPHALEXIN 500 MG PO CAPS: 500.0000 mg | ORAL_CAPSULE | Freq: Two times a day (BID) | ORAL | 0 refills | Status: DC

## 2018-02-15 MED ORDER — CEFAZOLIN SODIUM-DEXTROSE 2-4 GM/100ML-% IV SOLN
2.0000 g | INTRAVENOUS | Status: AC
  Administered 2018-02-15: 2 g via INTRAVENOUS
  Filled 2018-02-15: qty 100

## 2018-02-15 MED ORDER — MEPERIDINE HCL 50 MG/ML IJ SOLN: 6.2500 mg | INTRAMUSCULAR | Status: DC | PRN

## 2018-02-15 MED ORDER — STERILE WATER FOR IRRIGATION IR SOLN
Status: DC | PRN
  Administered 2018-02-15: 1000 mL
  Administered 2018-02-15: 3000 mL

## 2018-02-15 MED ORDER — PROMETHAZINE HCL 25 MG/ML IJ SOLN: 6.2500 mg | INTRAMUSCULAR | Status: DC | PRN

## 2018-02-15 SURGICAL SUPPLY — 15 items
BAG DRAIN URO TABLE W/ADPT NS (BAG) ×2 IMPLANT
CLOTH BEACON ORANGE TIMEOUT ST (SAFETY) ×2 IMPLANT
GLOVE BIO SURGEON STRL SZ8 (GLOVE) ×2 IMPLANT
GLOVE BIOGEL PI IND STRL 7.0 (GLOVE) ×2 IMPLANT
GLOVE BIOGEL PI INDICATOR 7.0 (GLOVE) ×2
GLOVE ECLIPSE 6.5 STRL STRAW (GLOVE) ×2 IMPLANT
GOWN STRL REUS W/TWL LRG LVL3 (GOWN DISPOSABLE) ×2 IMPLANT
GOWN STRL REUS W/TWL XL LVL3 (GOWN DISPOSABLE) ×2 IMPLANT
KIT TURNOVER CYSTO (KITS) ×2 IMPLANT
MANIFOLD NEPTUNE II (INSTRUMENTS) ×2 IMPLANT
PACK CYSTO (CUSTOM PROCEDURE TRAY) ×2 IMPLANT
PAD ARMBOARD 7.5X6 YLW CONV (MISCELLANEOUS) ×2 IMPLANT
SYSTEM UROLIFT (Male Continence) ×12 IMPLANT
WATER STERILE IRR 1000ML POUR (IV SOLUTION) ×2 IMPLANT
WATER STERILE IRR 3000ML UROMA (IV SOLUTION) ×2 IMPLANT

## 2018-02-15 NOTE — Anesthesia Postprocedure Evaluation (Signed)
Anesthesia Post Note  Patient: Barry Carpenter  Procedure(s) Performed: CYSTOSCOPY WITH INSERTION OF UROLIFT (N/A )  Anesthesia Type: General Level of consciousness: awake and alert and oriented Pain management: pain level controlled Vital Signs Assessment: post-procedure vital signs reviewed and stable Respiratory status: spontaneous breathing Cardiovascular status: stable Postop Assessment: no apparent nausea or vomiting Anesthetic complications: no     Last Vitals:  Vitals:   02/15/18 0640 02/15/18 0802  BP: 128/87 117/79  Pulse: 73 88  Resp: (!) 21 16  Temp: 36.4 C 36.6 C  SpO2: 95% 96%    Last Pain:  Vitals:   02/15/18 0802  TempSrc:   PainSc: 0-No pain                 ADAMS, AMY A

## 2018-02-15 NOTE — Interval H&P Note (Signed)
History and Physical Interval Note:  02/15/2018 7:19 AM  Barry Carpenter  has presented today for surgery, with the diagnosis of BENIGN PROSTATIC HYPERPLASIA  The various methods of treatment have been discussed with the patient and family. After consideration of risks, benefits and other options for treatment, the patient has consented to  Procedure(s): CYSTOSCOPY WITH INSERTION OF UROLIFT (N/A) as a surgical intervention .  The patient's history has been reviewed, patient examined, no change in status, stable for surgery.  I have reviewed the patient's chart and labs.  Questions were answered to the patient's satisfaction.     Bertram MillardStephen M Brasen Bundren

## 2018-02-15 NOTE — Discharge Instructions (Signed)
1.  It is okay to resume all your medications, including Plavix, except for the finasteride, which you do not need to take anymore  2.  Continue the tamsulosin/Flomax for 1 more week, then it is okay to stop that.  If you have a urgency and frequency, you can continue the oxybutynin  3.  Expect significant urgency and frequency for up to 2 to 3 weeks  4.  Also expect blood in your urine for a few days, especially at the beginning of your stream.  If this becomes worse with you on Plavix, stop the Plavix for a few days.       Cystoscopy, Care After Refer to this sheet in the next few weeks. These instructions provide you with information about caring for yourself after your procedure. Your health care provider may also give you more specific instructions. Your treatment has been planned according to current medical practices, but problems sometimes occur. Call your health care provider if you have any problems or questions after your procedure. What can I expect after the procedure? After the procedure, it is common to have:  Mild pain when you urinate. Pain should stop within a few minutes after you urinate. This may last for up to 1 week.  A small amount of blood in your urine for several days.  Feeling like you need to urinate but producing only a small amount of urine.  Follow these instructions at home:  Medicines  Take over-the-counter and prescription medicines only as told by your health care provider.  If you were prescribed an antibiotic medicine, take it as told by your health care provider. Do not stop taking the antibiotic even if you start to feel better. General instructions   Return to your normal activities as told by your health care provider. Ask your health care provider what activities are safe for you.  Do not drive for 24 hours if you received a sedative.  Watch for any blood in your urine. If the amount of blood in your urine increases, call your health  care provider.  Follow instructions from your health care provider about eating or drinking restrictions.  If a tissue sample was removed for testing (biopsy) during your procedure, it is your responsibility to get your test results. Ask your health care provider or the department performing the test when your results will be ready.  Drink enough fluid to keep your urine clear or pale yellow.  Keep all follow-up visits as told by your health care provider. This is important. Contact a health care provider if:  You have pain that gets worse or does not get better with medicine, especially pain when you urinate.  You have difficulty urinating. Get help right away if:  You have more blood in your urine.  You have blood clots in your urine.  You have abdominal pain.  You have a fever or chills.  You are unable to urinate. This information is not intended to replace advice given to you by your health care provider. Make sure you discuss any questions you have with your health care provider. Document Released: 02/11/2005 Document Revised: 12/31/2015 Document Reviewed: 06/11/2015 Elsevier Interactive Patient Education  2018 [employer]   General Anesthesia, Adult, Care After These instructions provide you with information about caring for yourself after your procedure. Your health care provider may also give you more specific instructions. Your treatment has been planned according to current medical practices, but problems sometimes occur. Call your health care provider  if you have any problems or questions after your procedure. What can I expect after the procedure? After the procedure, it is common to have:  Vomiting.  A sore throat.  Mental slowness.  It is common to feel:  Nauseous.  Cold or shivery.  Sleepy.  Tired.  Sore or achy, even in parts of your body where you did not have surgery.  Follow these instructions at home: For at least 24 hours after the  procedure:  Do not: ? Participate in activities where you could fall or become injured. ? Drive. ? Use heavy machinery. ? Drink alcohol. ? Take sleeping pills or medicines that cause drowsiness. ? Make important decisions or sign legal documents. ? Take care of children on your own.  Rest. Eating and drinking  If you vomit, drink water, juice, or soup when you can drink without vomiting.  Drink enough fluid to keep your urine clear or pale yellow.  Make sure you have little or no nausea before eating solid foods.  Follow the diet recommended by your health care provider. General instructions  Have a responsible adult stay with you until you are awake and alert.  Return to your normal activities as told by your health care provider. Ask your health care provider what activities are safe for you.  Take over-the-counter and prescription medicines only as told by your health care provider.  If you smoke, do not smoke without supervision.  Keep all follow-up visits as told by your health care provider. This is important. Contact a health care provider if:  You continue to have nausea or vomiting at home, and medicines are not helpful.  You cannot drink fluids or start eating again.  You cannot urinate after 8-12 hours.  You develop a skin rash.  You have fever.  You have increasing redness at the site of your procedure. Get help right away if:  You have difficulty breathing.  You have chest pain.  You have unexpected bleeding.  You feel that you are having a life-threatening or urgent problem. This information is not intended to replace advice given to you by your health care provider. Make sure you discuss any questions you have with your health care provider. Document Released: 10/31/2000 Document Revised: 12/28/2015 Document Reviewed: 07/09/2015 Elsevier Interactive Patient Education  [employer].

## 2018-02-15 NOTE — Transfer of Care (Signed)
Immediate Anesthesia Transfer of Care Note  Patient: Barry Carpenter  Procedure(s) Performed: CYSTOSCOPY WITH INSERTION OF UROLIFT (N/A )  Patient Location: PACU  Anesthesia Type:General  Level of Consciousness: awake, alert , oriented and patient cooperative  Airway & Oxygen Therapy: Patient Spontanous Breathing  Post-op Assessment: Report given to RN and Post -op Vital signs reviewed and stable  Post vital signs: Reviewed and stable  Last Vitals:  Vitals Value Taken Time  BP 117/79 02/15/2018  8:02 AM  Temp 36.6 C 02/15/2018  8:02 AM  Pulse 80 02/15/2018  8:06 AM  Resp 13 02/15/2018  8:06 AM  SpO2 94 % 02/15/2018  8:06 AM  Vitals shown include unvalidated device data.  Last Pain:  Vitals:   02/15/18 0640  TempSrc: Oral      Patients Stated Pain Goal: 5 (02/15/18 0640)  Complications: No apparent anesthesia complications

## 2018-02-15 NOTE — Op Note (Signed)
Preoperative diagnosis: BPH with obstructive symptomatology.  Postoperative diagnosis: Same  Principal procedure: Urolift procedure, with the placement of 6 implants (5 implants implanted, one pulled through).  Surgeon: Retta Dionesahlstedt  Anesthesia: Gen. with LMA  Complications: None  Drains: None  Estimated blood loss: Less than 25 mL  Indications: 77 year old male with obstructive symptomatology secondary to BPH.  The patient's symptoms have progressed, and he has requested further management.  Management options including TURP with resection/ablation of the prostate as well as Urolift were discussed.  The patient has chosen to have a Urolift procedure.  He has been instructed to the procedure as well as risks and complications which include but are not limited to infection, bleeding, and inadequate treatment with the Urolift procedure alone, anesthetic complications, among others.  He understands these and desires to proceed.  Findings: Using the 17 French cystoscope, urethra and bladder were inspected.  There were no urethral lesions.  Prostatic urethra was obstructed secondary to bilobar hypertrophy.  The bladder was inspected circumferentially.  This revealed normal findings.  Description of procedure: The patient was properly identified in the holding area.  He received preoperative IV antibiotics.  He was taken to the operating room where general anesthetic was administered with the LMA.  He is placed in the dorsolithotomy position.  Genitalia and perineum were prepped and draped.  Proper timeout was performed.  A 46F cystoscope was inserted into the bladder. The cystoscopy bridge was replaced with a UroLift delivery device.The first treatment site was the patient's right side approximately 1.5cm distal to the bladder neck. The distal tip of the delivery device was then angled laterally approximately 20 degrees at this position to compress the lateral lobe. The trigger was pulled, thereby  deploying a needle containing the implant through the prostate. The needle was then retracted, allowing one end of the implant to be delivered to the capsular surface of the prostate. The implant was then tensioned to assure capsular seating and removal of slack monofilament. The device was then angled back toward midline and slowly advanced proximally until cystoscopic verification of the monofilament being centered in the delivery bay. The urethral end piece was then affixed to the monofilament thereby tailoring the size of the implant. Excess filament was then severed. The delivery device was then re-advanced into the bladder. The delivery device was then replaced with cystoscope and bridge and the implant location and opening effect was confirmed cystoscopically. The same procedure was then repeated on the left side, and 1  additional implant was delivered just proximal to the verumontanum on left side of the prostate, following the same technique.  An implant aimed at the right distal prostate pulled through and was not repeated.  2 more implants were delivered at the bladder neck to alleviate a "fishmouth" situation at the proximal prostatic urethra.  One was put at the 1 o'clock position, one at the 11 o'clock position.  This created an open anterior channel.   A final cystoscopy was conducted first to inspect the location and state of each implant and second, to confirm the presence of a continuous anterior channel was present through the prostatic urethra with irrigation flow turned off.  5 implants were delivered in total.  Following this, the scope was removed and he was then awakened and taken to the PACU in stable condition.  He tolerated the procedure well.

## 2018-02-15 NOTE — Anesthesia Preprocedure Evaluation (Signed)
Anesthesia Evaluation  Patient identified by MRN, date of birth, ID band Patient awake    Reviewed: Allergy & Precautions, H&P , NPO status , Patient's Chart, lab work & pertinent test results, reviewed documented beta blocker date and time   Airway Mallampati: III  TM Distance: >3 FB Neck ROM: full    Dental no notable dental hx. (+) Teeth Intact, Dental Advidsory Given   Pulmonary neg pulmonary ROS,    Pulmonary exam normal breath sounds clear to auscultation       Cardiovascular Exercise Tolerance: Good hypertension, On Medications negative cardio ROS   Rhythm:regular Rate:Normal     Neuro/Psych negative neurological ROS  negative psych ROS   GI/Hepatic negative GI ROS, Neg liver ROS,   Endo/Other  negative endocrine ROSdiabetes, Well Controlled, Type 2  Renal/GU Renal diseasenegative Renal ROS  negative genitourinary   Musculoskeletal   Abdominal   Peds  Hematology negative hematology ROS (+)   Anesthesia Other Findings 12 lead NSR kidnet stone hx [id number]  Reproductive/Obstetrics negative OB ROS                             Anesthesia Physical Anesthesia Plan  ASA: III  Anesthesia Plan: General LMA   Post-op Pain Management:    Induction:   PONV Risk Score and Plan:   Airway Management Planned:   Additional Equipment:   Intra-op Plan:   Post-operative Plan:   Informed Consent: I have reviewed the patients History and Physical, chart, labs and discussed the procedure including the risks, benefits and alternatives for the proposed anesthesia with the patient or authorized representative who has indicated his/her understanding and acceptance.   Dental Advisory Given  Plan Discussed with: CRNA and Anesthesiologist  Anesthesia Plan Comments:         Anesthesia Quick Evaluation

## 2018-02-15 NOTE — Anesthesia Procedure Notes (Signed)
Procedure Name: LMA Insertion Date/Time: 02/15/2018 7:31 AM Performed by: Pernell DupreAdams, Amy A, CRNA Pre-anesthesia Checklist: Patient identified, Timeout performed, Emergency Drugs available, Suction available and Patient being monitored Patient Re-evaluated:Patient Re-evaluated prior to induction Preoxygenation: Pre-oxygenation with 100% oxygen Induction Type: IV induction Ventilation: Mask ventilation without difficulty LMA: LMA inserted LMA Size: 5.0 Number of attempts: 1 Placement Confirmation: positive ETCO2 and breath sounds checked- equal and bilateral Tube secured with: Tape Dental Injury: Teeth and Oropharynx as per pre-operative assessment

## 2018-02-16 ENCOUNTER — Encounter (HOSPITAL_COMMUNITY): Payer: Self-pay | Admitting: Urology

## 2018-02-25 ENCOUNTER — Other Ambulatory Visit: Payer: Self-pay

## 2018-02-25 ENCOUNTER — Inpatient Hospital Stay (HOSPITAL_COMMUNITY)
Admission: EM | Admit: 2018-02-25 | Discharge: 2018-02-26 | DRG: 921 | Disposition: A | Discharge status: Home / Self Care | Payer: MEDICARE | Source: Other Acute Inpatient Hospital | Attending: Urology | Admitting: Urology

## 2018-02-25 ENCOUNTER — Encounter (HOSPITAL_COMMUNITY): Payer: Self-pay

## 2018-02-25 DIAGNOSIS — M109 Gout, unspecified: Secondary | ICD-10-CM | POA: Diagnosis present

## 2018-02-25 DIAGNOSIS — Z803 Family history of malignant neoplasm of breast: Secondary | ICD-10-CM

## 2018-02-25 DIAGNOSIS — E1122 Type 2 diabetes mellitus with diabetic chronic kidney disease: Secondary | ICD-10-CM | POA: Diagnosis present

## 2018-02-25 DIAGNOSIS — R339 Retention of urine, unspecified: Secondary | ICD-10-CM | POA: Diagnosis not present

## 2018-02-25 DIAGNOSIS — N183 Chronic kidney disease, stage 3 (moderate): Secondary | ICD-10-CM | POA: Diagnosis present

## 2018-02-25 DIAGNOSIS — Z87442 Personal history of urinary calculi: Secondary | ICD-10-CM

## 2018-02-25 DIAGNOSIS — R310 Gross hematuria: Secondary | ICD-10-CM | POA: Diagnosis not present

## 2018-02-25 DIAGNOSIS — E119 Type 2 diabetes mellitus without complications: Secondary | ICD-10-CM

## 2018-02-25 DIAGNOSIS — I69354 Hemiplegia and hemiparesis following cerebral infarction affecting left non-dominant side: Secondary | ICD-10-CM | POA: Diagnosis not present

## 2018-02-25 DIAGNOSIS — Z7902 Long term (current) use of antithrombotics/antiplatelets: Secondary | ICD-10-CM | POA: Diagnosis not present

## 2018-02-25 DIAGNOSIS — I739 Peripheral vascular disease, unspecified: Secondary | ICD-10-CM | POA: Diagnosis not present

## 2018-02-25 DIAGNOSIS — Z8673 Personal history of transient ischemic attack (TIA), and cerebral infarction without residual deficits: Secondary | ICD-10-CM

## 2018-02-25 DIAGNOSIS — N401 Enlarged prostate with lower urinary tract symptoms: Secondary | ICD-10-CM | POA: Diagnosis present

## 2018-02-25 DIAGNOSIS — I10 Essential (primary) hypertension: Secondary | ICD-10-CM | POA: Diagnosis present

## 2018-02-25 DIAGNOSIS — R338 Other retention of urine: Secondary | ICD-10-CM | POA: Diagnosis not present

## 2018-02-25 DIAGNOSIS — I129 Hypertensive chronic kidney disease with stage 1 through stage 4 chronic kidney disease, or unspecified chronic kidney disease: Secondary | ICD-10-CM | POA: Diagnosis not present

## 2018-02-25 DIAGNOSIS — N99820 Postprocedural hemorrhage and hematoma of a genitourinary system organ or structure following a genitourinary system procedure: Secondary | ICD-10-CM | POA: Diagnosis not present

## 2018-02-25 DIAGNOSIS — N4889 Other specified disorders of penis: Secondary | ICD-10-CM | POA: Diagnosis not present

## 2018-02-25 DIAGNOSIS — Z7901 Long term (current) use of anticoagulants: Secondary | ICD-10-CM | POA: Diagnosis not present

## 2018-02-25 DIAGNOSIS — Z808 Family history of malignant neoplasm of other organs or systems: Secondary | ICD-10-CM | POA: Diagnosis not present

## 2018-02-25 DIAGNOSIS — R319 Hematuria, unspecified: Secondary | ICD-10-CM | POA: Diagnosis not present

## 2018-02-25 DIAGNOSIS — N3289 Other specified disorders of bladder: Secondary | ICD-10-CM | POA: Diagnosis not present

## 2018-02-25 DIAGNOSIS — E118 Type 2 diabetes mellitus with unspecified complications: Secondary | ICD-10-CM | POA: Diagnosis not present

## 2018-02-25 DIAGNOSIS — R52 Pain, unspecified: Secondary | ICD-10-CM | POA: Diagnosis not present

## 2018-02-25 DIAGNOSIS — H269 Unspecified cataract: Secondary | ICD-10-CM | POA: Diagnosis not present

## 2018-02-25 HISTORY — DX: Chronic kidney disease, unspecified: N18.9

## 2018-02-25 HISTORY — DX: Cerebral infarction, unspecified: I63.9

## 2018-02-25 LAB — URINALYSIS, ROUTINE W REFLEX MICROSCOPIC

## 2018-02-25 LAB — BASIC METABOLIC PANEL
Anion gap: 15 (ref 5–15)
BUN: 28 mg/dL — ABNORMAL HIGH (ref 8–23)
CO2: 20 mmol/L — AB (ref 22–32)
CREATININE: 1.61 mg/dL — AB (ref 0.61–1.24)
Calcium: 8.3 mg/dL — ABNORMAL LOW (ref 8.9–10.3)
Chloride: 106 mmol/L (ref 98–111)
GFR calc non Af Amer: 40 mL/min — ABNORMAL LOW (ref >60)
GFR, EST AFRICAN AMERICAN: 47 mL/min — AB (ref >60)
Glucose, Bld: 124 mg/dL — ABNORMAL HIGH (ref 70–99)
POTASSIUM: 3.5 mmol/L (ref 3.5–5.1)
Sodium: 141 mmol/L (ref 135–145)

## 2018-02-25 LAB — HEPATIC FUNCTION PANEL
ALT: 24 U/L (ref 0–44)
AST: 21 U/L (ref 15–41)
Albumin: 3.5 g/dL (ref 3.5–5.0)
Alkaline Phosphatase: 64 U/L (ref 38–126)
BILIRUBIN INDIRECT: 0.8 mg/dL (ref 0.3–0.9)
Bilirubin, Direct: 0.2 mg/dL (ref 0.0–0.2)
TOTAL PROTEIN: 6.8 g/dL (ref 6.5–8.1)
Total Bilirubin: 1 mg/dL (ref 0.3–1.2)

## 2018-02-25 LAB — URINALYSIS, MICROSCOPIC (REFLEX)

## 2018-02-25 LAB — PROTIME-INR
INR: 1.04
Prothrombin Time: 13.5 seconds (ref 11.4–15.2)

## 2018-02-25 LAB — CBC WITH DIFFERENTIAL/PLATELET
BASOS ABS: 0 10*3/uL (ref 0.0–0.1)
Basophils Relative: 0 %
Eosinophils Absolute: 0 10*3/uL (ref 0.0–0.7)
Eosinophils Relative: 0 %
HEMATOCRIT: 47 % (ref 39.0–52.0)
Hemoglobin: 16.2 g/dL (ref 13.0–17.0)
LYMPHS PCT: 21 %
Lymphs Abs: 2.4 10*3/uL (ref 0.7–4.0)
MCH: 33.5 pg (ref 26.0–34.0)
MCHC: 34.5 g/dL (ref 30.0–36.0)
MCV: 97.1 fL (ref 78.0–100.0)
MONO ABS: 0.5 10*3/uL (ref 0.1–1.0)
Monocytes Relative: 5 %
NEUTROS ABS: 8.1 10*3/uL — AB (ref 1.7–7.7)
Neutrophils Relative %: 74 %
Platelets: 186 10*3/uL (ref 150–400)
RBC: 4.84 MIL/uL (ref 4.22–5.81)
RDW: 14.9 % (ref 11.5–15.5)
WBC: 11.1 10*3/uL — ABNORMAL HIGH (ref 4.0–10.5)

## 2018-02-25 LAB — APTT: APTT: 24 s (ref 24–36)

## 2018-02-25 LAB — GLUCOSE, CAPILLARY: Glucose-Capillary: 126 mg/dL — ABNORMAL HIGH (ref 70–99)

## 2018-02-25 MED ORDER — DIPHENHYDRAMINE HCL 50 MG/ML IJ SOLN: 12.5000 mg | Freq: Four times a day (QID) | INTRAMUSCULAR | Status: DC | PRN

## 2018-02-25 MED ORDER — POTASSIUM CHLORIDE IN NACL 20-0.45 MEQ/L-% IV SOLN: INTRAVENOUS | Status: DC

## 2018-02-25 MED ORDER — LISINOPRIL-HYDROCHLOROTHIAZIDE 20-25 MG PO TABS: 1.0000 | ORAL_TABLET | Freq: Every day | ORAL | Status: DC

## 2018-02-25 MED ORDER — LISINOPRIL 20 MG PO TABS
20.0000 mg | ORAL_TABLET | Freq: Every day | ORAL | Status: DC
  Administered 2018-02-25 – 2018-02-26 (×2): 20 mg via ORAL
  Filled 2018-02-25 (×2): qty 1

## 2018-02-25 MED ORDER — POTASSIUM CHLORIDE IN NACL 20-0.45 MEQ/L-% IV SOLN
INTRAVENOUS | Status: DC
  Administered 2018-02-25: via INTRAVENOUS
  Filled 2018-02-25 (×2): qty 1000

## 2018-02-25 MED ORDER — DIPHENHYDRAMINE HCL 12.5 MG/5ML PO ELIX: 12.5000 mg | ORAL_SOLUTION | Freq: Four times a day (QID) | ORAL | Status: DC | PRN

## 2018-02-25 MED ORDER — INSULIN ASPART 100 UNIT/ML ~~LOC~~ SOLN: 0.0000 [IU] | Freq: Every day | SUBCUTANEOUS | Status: DC

## 2018-02-25 MED ORDER — LINAGLIPTIN 5 MG PO TABS
5.0000 mg | ORAL_TABLET | Freq: Every day | ORAL | Status: DC
Start: 2018-02-26 — End: 2018-02-26
  Administered 2018-02-26: 5 mg via ORAL
  Filled 2018-02-25: qty 1

## 2018-02-25 MED ORDER — PRAVASTATIN SODIUM 40 MG PO TABS
40.0000 mg | ORAL_TABLET | Freq: Every day | ORAL | Status: DC
  Administered 2018-02-25 – 2018-02-26 (×2): 40 mg via ORAL
  Filled 2018-02-25 (×2): qty 1

## 2018-02-25 MED ORDER — ZOLPIDEM TARTRATE 5 MG PO TABS
5.0000 mg | ORAL_TABLET | Freq: Every evening | ORAL | Status: DC | PRN
  Administered 2018-02-25: 5 mg via ORAL
  Filled 2018-02-25: qty 1

## 2018-02-25 MED ORDER — OXYBUTYNIN CHLORIDE 5 MG PO TABS
5.0000 mg | ORAL_TABLET | Freq: Every day | ORAL | Status: DC
  Administered 2018-02-25 – 2018-02-26 (×2): 5 mg via ORAL
  Filled 2018-02-25 (×2): qty 1

## 2018-02-25 MED ORDER — AMLODIPINE BESYLATE 10 MG PO TABS
10.0000 mg | ORAL_TABLET | Freq: Every day | ORAL | Status: DC
  Administered 2018-02-25 – 2018-02-26 (×2): 10 mg via ORAL
  Filled 2018-02-25 (×2): qty 1

## 2018-02-25 MED ORDER — MORPHINE SULFATE (PF) 2 MG/ML IV SOLN
2.0000 mg | INTRAVENOUS | Status: DC | PRN
  Administered 2018-02-25: 2 mg via INTRAVENOUS
  Filled 2018-02-25: qty 1

## 2018-02-25 MED ORDER — SENNOSIDES-DOCUSATE SODIUM 8.6-50 MG PO TABS
1.0000 | ORAL_TABLET | Freq: Every evening | ORAL | Status: DC | PRN
  Administered 2018-02-25: 1 via ORAL
  Filled 2018-02-25: qty 1

## 2018-02-25 MED ORDER — TAMSULOSIN HCL 0.4 MG PO CAPS
0.4000 mg | ORAL_CAPSULE | Freq: Two times a day (BID) | ORAL | Status: DC
  Administered 2018-02-25 – 2018-02-26 (×2): 0.4 mg via ORAL
  Filled 2018-02-25 (×2): qty 1

## 2018-02-25 MED ORDER — INSULIN ASPART 100 UNIT/ML ~~LOC~~ SOLN: 0.0000 [IU] | Freq: Three times a day (TID) | SUBCUTANEOUS | Status: DC

## 2018-02-25 MED ORDER — MORPHINE SULFATE (PF) 4 MG/ML IV SOLN
4.0000 mg | Freq: Once | INTRAVENOUS | Status: AC
  Administered 2018-02-25: 4 mg via INTRAVENOUS
  Filled 2018-02-25: qty 1

## 2018-02-25 MED ORDER — BISACODYL 10 MG RE SUPP: 10.0000 mg | Freq: Every day | RECTAL | Status: DC | PRN

## 2018-02-25 MED ORDER — CLONIDINE HCL 0.2 MG PO TABS
0.2000 mg | ORAL_TABLET | Freq: Two times a day (BID) | ORAL | Status: DC
  Administered 2018-02-25 – 2018-02-26 (×2): 0.2 mg via ORAL
  Filled 2018-02-25 (×2): qty 1

## 2018-02-25 MED ORDER — ACETAMINOPHEN 325 MG PO TABS: 650.0000 mg | ORAL_TABLET | ORAL | Status: DC | PRN

## 2018-02-25 MED ORDER — SODIUM CHLORIDE 0.9 % IV BOLUS
1000.0000 mL | Freq: Once | INTRAVENOUS | Status: AC
  Administered 2018-02-25: 1000 mL via INTRAVENOUS

## 2018-02-25 MED ORDER — ALLOPURINOL 300 MG PO TABS
300.0000 mg | ORAL_TABLET | Freq: Every day | ORAL | Status: DC
  Administered 2018-02-25 – 2018-02-26 (×2): 300 mg via ORAL
  Filled 2018-02-25 (×2): qty 1

## 2018-02-25 MED ORDER — HYDROCHLOROTHIAZIDE 25 MG PO TABS
25.0000 mg | ORAL_TABLET | Freq: Every day | ORAL | Status: DC
Start: 2018-02-25 — End: 2018-02-26
  Administered 2018-02-25 – 2018-02-26 (×2): 25 mg via ORAL
  Filled 2018-02-25 (×2): qty 1

## 2018-02-25 MED ORDER — ONDANSETRON HCL 4 MG/2ML IJ SOLN: 4.0000 mg | INTRAMUSCULAR | Status: DC | PRN

## 2018-02-25 MED ORDER — SODIUM CHLORIDE 0.9 % IV SOLN
1.0000 g | INTRAVENOUS | Status: DC
  Administered 2018-02-25: 1 g via INTRAVENOUS
  Filled 2018-02-25: qty 10
  Filled 2018-02-25: qty 1

## 2018-02-25 MED ORDER — FLEET ENEMA 7-19 GM/118ML RE ENEM: 1.0000 | ENEMA | Freq: Once | RECTAL | Status: DC | PRN

## 2018-02-25 MED ORDER — HYDROCODONE-ACETAMINOPHEN 5-325 MG PO TABS
1.0000 | ORAL_TABLET | ORAL | Status: DC | PRN
  Administered 2018-02-26: 2 via ORAL
  Filled 2018-02-25 (×2): qty 2

## 2018-02-25 NOTE — ED Notes (Signed)
Urology paged

## 2018-02-25 NOTE — ED Notes (Signed)
Pt states he needs to have BM. Placed on bed pan.

## 2018-02-25 NOTE — Consult Note (Signed)
[facility] H&P   Patient Demographics:    Barry Carpenter, is a 77 y.o. male  MRN: [id number]   DOB - Dec 10, 1940  Admit Date - 02/25/2018  Outpatient Primary MD for the patient is Toma DeitersHasanaj, Xaje A, MD  Referring MD/NP/PA:  Chaney Mallingavid Yao  Outpatient Specialists:  Emeline GinsStephen Dahlsteadt  Patient coming from: home  Chief Complaint  Patient presents with  . Post-op Problem    Consult for management of Dm2 by Bjorn PippinJohn Wrenn   HPI:    Barry Carpenter  is a 77 y.o. male, w Dm2, CVA, who recently had urolift procedure 02/15/2018 apparently restarted his plavix a few days ago and now has gross hematuria starting today. + blood clots.   Slight dysuria earlier today, but none presently.  Denies fever, chills, n/v, flank pain.   In ED, pt was seen by urology and a catheter was placed and bladder irrigated.    Wbc 11.1., hgb 16.2, Plt 186  Pt will be admitted for gross hematuria.       Review of systems:    In addition to the HPI above,  No Fever-chills, No Headache, No changes with Vision or hearing, No problems swallowing food or Liquids, No Chest pain, Cough or Shortness of Breath, No Abdominal pain, No Nausea or Vommitting, Bowel movements are regular, No Blood in stool  No dysuria, No new skin rashes or bruises, No new joints pains-aches,  No new weakness, tingling, numbness in any extremity, No recent weight gain or loss, No polyuria, polydypsia or polyphagia, No significant Mental Stressors.  A full 10 point Review of Systems was done, except as stated above, all other Review of Systems were negative.   With Past History of the following :    Past Medical History:  Diagnosis Date  . CKD (chronic kidney disease)   . Cystic dysplasia of one kidney   . Diabetes mellitus without complication (HCC)   . Gout   . History of kidney stones   . Hypertension   . Stroke [facility](HCC)       Past  Surgical History:  Procedure Laterality Date  . CYSTOSCOPY WITH INSERTION OF UROLIFT N/A 02/15/2018   Procedure: CYSTOSCOPY WITH INSERTION OF UROLIFT;  Surgeon: Marcine Matarahlstedt, Stephen, MD;  Location: [facility];  Service: Urology;  Laterality: N/A;  . LITHOTRIPSY        Social History:     Social History   Tobacco Use  . Smoking status: Never Smoker  . Smokeless tobacco: Never Used  Substance Use Topics  . Alcohol use: Not Currently     Lives - at home  Mobility - able to walk by self     Family History :     Family History  Problem Relation Age of Onset  . Breast cancer Mother   . Melanoma Mother        Home Medications:   Prior to  Admission medications   Medication Sig Start Date End Date Taking? Authorizing Provider  allopurinol (ZYLOPRIM) 300 MG tablet Take 300 mg by mouth daily.   Yes [provider]  amLODipine (NORVASC) 10 MG tablet Take 10 mg by mouth daily.   Yes [provider]  cloNIDine (CATAPRES) 0.2 MG tablet Take 0.2 mg by mouth 2 (two) times daily. 11/14/17  Yes [provider]  clopidogrel (PLAVIX) 75 MG tablet Take 75 mg by mouth daily.   Yes [provider]  JANUVIA 100 MG tablet Take 100 mg by mouth daily. 11/14/17  Yes [provider]  KLOR-CON M10 10 MEQ tablet Take 10 mEq by mouth daily. 11/14/17  Yes [provider]  lisinopril-hydrochlorothiazide (PRINZIDE,ZESTORETIC) 20-25 MG tablet Take 1 tablet by mouth daily. 11/14/17  Yes [provider]  oxybutynin (DITROPAN) 5 MG tablet Take 5 mg by mouth daily.   Yes [provider]  pravastatin (PRAVACHOL) 40 MG tablet Take 40 mg by mouth daily.   Yes [provider]  tamsulosin (FLOMAX) 0.4 MG CAPS capsule Take 0.4 mg by mouth 2 (two) times daily.    Yes [provider]  cephALEXin (KEFLEX) 500 MG capsule Take 1 capsule (500 mg total) by mouth 2 (two) times daily. Patient not taking: Reported on 02/25/2018 02/15/18   Marcine Matar, MD     Allergies:    No Known Allergies   Physical Exam:   Vitals  Blood pressure (!) 175/109, pulse (!) 108, temperature 97.6 F (36.4 C), temperature source Oral, resp. rate 14, height 5\' 10"  (1.778 m), weight 94.3 kg (208 lb), SpO2 100 %.   1. General  lying in bed in NAD,   2. Normal affect and insight, Not Suicidal or Homicidal, Awake Alert, Oriented X 3.  3. No F.N deficits, ALL C.Nerves Intact, Strength 5/5 all 4 extremities, Sensation intact all 4 extremities, Plantars down going.  4. Ears and Eyes appear Normal, Conjunctivae clear, PERRLA. Moist Oral Mucosa.  5. Supple Neck, No JVD, No cervical lymphadenopathy appriciated, No Carotid Bruits.  6. Symmetrical Chest wall movement, Good air movement bilaterally, CTAB.  7. RRR, No Gallops, Rubs or Murmurs, No Parasternal Heave.  8. Positive Bowel Sounds, Abdomen Soft, No tenderness, No organomegaly appriciated,No rebound -guarding or rigidity.  9.  No Cyanosis, Normal Skin Turgor, No Skin Rash or Bruise.  10. Good muscle tone,  joints appear normal , no effusions, Normal ROM.  11. No Palpable Lymph Nodes in Neck or Axillae  Foley in place   Data Review:    CBC Recent Labs  Lab 02/25/18 1838  WBC 11.1*  HGB 16.2  HCT 47.0  PLT 186  MCV 97.1  MCH 33.5  MCHC 34.5  RDW 14.9  LYMPHSABS 2.4  MONOABS 0.5  EOSABS 0.0  BASOSABS 0.0   ------------------------------------------------------------------------------------------------------------------  Chemistries  No results for input(s): NA, K, CL, CO2, GLUCOSE, BUN, CREATININE, CALCIUM, MG, AST, ALT, ALKPHOS, BILITOT in the last 168 hours.  Invalid input(s): GFRCGP ------------------------------------------------------------------------------------------------------------------ estimated creatinine clearance is 43.2 mL/min (A) (by C-G formula based on SCr of 1.73 mg/dL  (H)). ------------------------------------------------------------------------------------------------------------------ No results for input(s): TSH, T4TOTAL, T3FREE, THYROIDAB in the last 72 hours.  Invalid input(s): FREET3  Coagulation profile No results for input(s): INR, PROTIME in the last 168 hours. ------------------------------------------------------------------------------------------------------------------- No results for input(s): DDIMER in the last 72 hours. -------------------------------------------------------------------------------------------------------------------  Cardiac Enzymes No results for input(s): CKMB, TROPONINI, MYOGLOBIN in the last 168 hours.  Invalid input(s): CK ------------------------------------------------------------------------------------------------------------------ No results  found for: BNP   ---------------------------------------------------------------------------------------------------------------  Urinalysis No results found for: COLORURINE, APPEARANCEUR, LABSPEC, PHURINE, GLUCOSEU, HGBUR, BILIRUBINUR, KETONESUR, PROTEINUR, UROBILINOGEN, NITRITE, LEUKOCYTESUR  ----------------------------------------------------------------------------------------------------------------   Imaging Results:    No results found.     Assessment & Plan:    Principal Problem:   Clot retention of urine Active Problems:   Gross hematuria    Gross hematuria Check ua  Check INR, PTT Check cbc Hold Plavix Cont oxybutynin Urology consulted by ED, appreciate input  CVA Hold Plavix as above Cont Pravastatin  Hypertension Cont Lisinopril/hydrochlorothiazide Cont amlodipine 10mg  po qday Start hydralazine 10mg  iv q6h prn sbp >160 Check cmp  Dm2 Cont tradjenta Would start  fsbs ac and qhs, ISS  Gout Cont Allopurinol  We will sign off, appreciate this consult, please feel free to call if need any further assistance.      DVT  Prophylaxis  - SCDs   AM Labs Ordered, also please review Full Orders  Family Communication: Admission, patients condition and plan of care including tests being ordered have been discussed with the patient  who indicate understanding and agree with the plan and Code Status.  Code Status  FULL CODE  Likely DC to  home  Condition GUARDED    Admission status:  observation  Time spent in minutes : 60   Pearson Grippe M.D on 02/25/2018 at 7:56 PM  Between 7am to 7pm - Pager - [phone] . After 7pm go to [email or web address] - password [facility]  [facility] - Office  [phone]

## 2018-02-25 NOTE — ED Triage Notes (Signed)
Pt had Urolift last Thrusday. Pt restarted plavix 2 days after procedure, throwing clots. [place]rrigated at [place] and cath placed without relief. Pt having bladder spasms.  [id number] 3 way in place placed today in [place]. 20g left hand.

## 2018-02-25 NOTE — ED Notes (Signed)
Bed: WA07 Expected date:  Expected time:  Means of arrival:  Comments[facility] Alvester MorinBell

## 2018-02-25 NOTE — H&P (Signed)
Urology Admission H&P  Chief Complaint: Hematuria.  History of Present Illness: Barry Carpenter had a Urolift placed at [facility] on 7/11.  He was doing well and resumed his Plavix on 7/19.  He had the onset of gross hematuria this morning and was taken by EMS to [facility].  A 30fr 3 way foley was placed and he was transferred to [facility].  The EDP irrigated him with with return of some clots but minimal relief of pain.   He is very uncomfortable with suprapubic pain.   He has no associated signs or symptoms.   Past Medical History:  Diagnosis Date  . Cystic dysplasia of one kidney   . Diabetes mellitus without complication (HCC)   . Gout   . History of kidney stones   . Hypertension    Past Surgical History:  Procedure Laterality Date  . CYSTOSCOPY WITH INSERTION OF UROLIFT N/A 02/15/2018   Procedure: CYSTOSCOPY WITH INSERTION OF UROLIFT;  Surgeon: Marcine Matar, MD;  Location: [facility];  Service: Urology;  Laterality: N/A;  . LITHOTRIPSY      Home Medications:  Current Facility-Administered Medications  Medication Dose Route Frequency Provider Last Rate Last Dose  . sodium chloride 0.9 % bolus 1,000 mL  1,000 mL Intravenous Once Charlynne Pander, MD 984 mL/hr at 02/25/18 1837 1,000 mL at 02/25/18 1837   Current Outpatient Medications  Medication Sig Dispense Refill Last Dose  . allopurinol (ZYLOPRIM) 300 MG tablet Take 300 mg by mouth daily.   02/24/2018 at Unknown time  . amLODipine (NORVASC) 10 MG tablet Take 10 mg by mouth daily.   02/24/2018 at Unknown time  . cloNIDine (CATAPRES) 0.2 MG tablet Take 0.2 mg by mouth 2 (two) times daily.   02/24/2018 at Unknown time  . clopidogrel (PLAVIX) 75 MG tablet Take 75 mg by mouth daily.   02/24/2018 at Unknown time  . JANUVIA 100 MG tablet Take 100 mg by mouth daily.   02/24/2018 at Unknown time  . KLOR-CON M10 10 MEQ tablet Take 10 mEq by mouth daily.   02/24/2018 at Unknown time  . lisinopril-hydrochlorothiazide  (PRINZIDE,ZESTORETIC) 20-25 MG tablet Take 1 tablet by mouth daily.   02/24/2018 at Unknown time  . oxybutynin (DITROPAN) 5 MG tablet Take 5 mg by mouth daily.   02/24/2018 at Unknown time  . pravastatin (PRAVACHOL) 40 MG tablet Take 40 mg by mouth daily.   02/24/2018 at Unknown time  . tamsulosin (FLOMAX) 0.4 MG CAPS capsule Take 0.4 mg by mouth 2 (two) times daily.    02/24/2018 at Unknown time  . cephALEXin (KEFLEX) 500 MG capsule Take 1 capsule (500 mg total) by mouth 2 (two) times daily. (Patient not taking: Reported on 02/25/2018) 6 capsule 0 Completed Course at Unknown time   Allergies: No Known Allergies  No family history on file. Social History:  reports that he has never smoked. He has never used smokeless tobacco. He reports that he drank alcohol. He reports that he does not use drugs.  Review of Systems  Constitutional:       Feels hot.   Gastrointestinal: Positive for abdominal pain.  All other systems reviewed and are negative.   Physical Exam:  Vital signs in last 24 hours: Temp:  [97.6 F (36.4 C)] 97.6 F (36.4 C) (07/21 1821) Pulse Rate:  [104-128] 108 (07/21 1900) Resp:  [14-20] 14 (07/21 1900) BP: (149-175)/(66-109) 175/109 (07/21 1900) SpO2:  [99 %-100 %] 100 % (07/21 1900) Weight:  [94.3 kg (  208 lb)] 94.3 kg (208 lb) (07/21 1824) Physical Exam  Constitutional: He is oriented to person, place, and time. He appears well-developed and well-nourished. He appears distressed (But relieved after irrigation and successful bladder drainage. ).  HENT:  Head: Normocephalic and atraumatic.  Cardiovascular: Regular rhythm, S1 normal and S2 normal. Tachycardia present.  No murmur heard. Respiratory: Effort normal and breath sounds normal. No respiratory distress.  GI: Soft. There is tenderness (suprapubic).  Genitourinary: Penis normal.  Genitourinary Comments: 5722fr 3 way foley at meatus with clot in the tube.    Musculoskeletal: Normal range of motion. He exhibits no edema  or tenderness.  Neurological: He is alert and oriented to person, place, and time.  Skin: Skin is warm and dry.  Psychiatric:  Agitated with pain.     Laboratory Data:  Results for orders placed or performed during the hospital encounter of 02/25/18 (from the past 24 hour(s))  CBC with Differential/Platelet     Status: Abnormal   Collection Time: 02/25/18  6:38 PM  Result Value Ref Range   WBC 11.1 (H) 4.0 - 10.5 K/uL   RBC 4.84 4.22 - 5.81 MIL/uL   Hemoglobin 16.2 13.0 - 17.0 g/dL   HCT 16.147.0 09.639.0 - 04.552.0 %   MCV 97.1 78.0 - 100.0 fL   MCH 33.5 26.0 - 34.0 pg   MCHC 34.5 30.0 - 36.0 g/dL   RDW 40.914.9 81.111.5 - 91.415.5 %   Platelets 186 150 - 400 K/uL   Neutrophils Relative % 74 %   Neutro Abs 8.1 (H) 1.7 - 7.7 K/uL   Lymphocytes Relative 21 %   Lymphs Abs 2.4 0.7 - 4.0 K/uL   Monocytes Relative 5 %   Monocytes Absolute 0.5 0.1 - 1.0 K/uL   Eosinophils Relative 0 %   Eosinophils Absolute 0.0 0.0 - 0.7 K/uL   Basophils Relative 0 %   Basophils Absolute 0.0 0.0 - 0.1 K/uL   No results found for this or any previous visit (from the past 240 hour(s)). Creatinine: No results for input(s): CREATININE in the last 168 hours. Baseline Creatinine: 1.73  Recent OR notes reviewed.   Labs reviewed.   Bladder was irrigated with 1500ml of NS with return of a large quantity of clot and several hundred ml of bloody urine.  Was light pink at the end of irrigation.    Impression/Assessment:  Clot retention with hematuria following Urolift and subsequent resumption of plavix.     Plan:  I am going to admit him for observation and prophylactic antibiotics and will keep him NPO post MN should the bleeding persist and a trip to the OR is required.    Barry PippinJohn Harlee Carpenter 02/25/2018, 7:37 PM

## 2018-02-25 NOTE — ED Notes (Signed)
Second attempt to call report made.

## 2018-02-25 NOTE — ED Provider Notes (Signed)
[facility]-EMERGENCY DEPT Provider Note   CSN: [id number] Arrival date & time: 02/25/18  1809     History   Chief Complaint Chief Complaint  Patient presents with  . Post-op Problem    HPI Barry Carpenter is a 77 y.o. male hx of DM, BPH, previous stroke on plavix, here presenting with hematuria.  Patient had a uro-lift done on 7/11 by Dr. Retta Diones for BPH.  Patient started Plavix 2 days ago.  Patient was doing well until this morning where he started having hematuria and penile pain.  Patient went to [facility] and had a three-way Foley catheter that was placed. Patient had persistent hematuria and pain so was sent to the ED for evaluation by urology. Dr. Alvester Morin was consulted and accepted the transfer.   The history is provided by the patient.    Past Medical History:  Diagnosis Date  . Cystic dysplasia of one kidney   . Diabetes mellitus without complication (HCC)   . Gout   . History of kidney stones   . Hypertension     Patient Active Problem List   Diagnosis Date Noted  . Gross hematuria 02/25/2018    Past Surgical History:  Procedure Laterality Date  . CYSTOSCOPY WITH INSERTION OF UROLIFT N/A 02/15/2018   Procedure: CYSTOSCOPY WITH INSERTION OF UROLIFT;  Surgeon: Marcine Matar, MD;  Location: [facility];  Service: Urology;  Laterality: N/A;  . LITHOTRIPSY          Home Medications    Prior to Admission medications   Medication Sig Start Date End Date Taking? Authorizing Provider  allopurinol (ZYLOPRIM) 300 MG tablet Take 300 mg by mouth daily.    [provider]  amLODipine (NORVASC) 10 MG tablet Take 10 mg by mouth daily.    [provider]  cephALEXin (KEFLEX) 500 MG capsule Take 1 capsule (500 mg total) by mouth 2 (two) times daily. 02/15/18   Marcine Matar, MD  cloNIDine (CATAPRES) 0.1 MG tablet Take 0.1 mg by mouth 2 (two) times daily.    [provider]  cloNIDine (CATAPRES) 0.2 MG tablet Take 0.2 mg by  mouth 2 (two) times daily. 11/14/17   [provider]  clopidogrel (PLAVIX) 75 MG tablet Take 75 mg by mouth daily.    [provider]  JANUVIA 100 MG tablet Take 100 mg by mouth daily. 11/14/17   [provider]  KLOR-CON M10 10 MEQ tablet Take 10 mEq by mouth daily. 11/14/17   [provider]  lisinopril (PRINIVIL,ZESTRIL) 10 MG tablet Take 10 mg by mouth daily.    [provider]  lisinopril-hydrochlorothiazide (PRINZIDE,ZESTORETIC) 20-25 MG tablet Take 1 tablet by mouth daily. 11/14/17   [provider]  oxybutynin (DITROPAN) 5 MG tablet Take 5 mg by mouth daily.    [provider]  pravastatin (PRAVACHOL) 40 MG tablet Take 40 mg by mouth daily.    [provider]  tamsulosin (FLOMAX) 0.4 MG CAPS capsule Take 0.4 mg by mouth.    [provider]    Family History No family history on file.  Social History Social History   Tobacco Use  . Smoking status: Never Smoker  . Smokeless tobacco: Never Used  Substance Use Topics  . Alcohol use: Not Currently  . Drug use: Never     Allergies   Patient has no known allergies.   Review of Systems Review of Systems  Genitourinary: Positive for hematuria and penile pain.  All other systems reviewed and  are negative.    Physical Exam Updated Vital Signs BP (!) 175/109   Pulse (!) 108   Temp 97.6 F (36.4 C) (Oral)   Resp 14   Ht 5\' 10"  (1.778 m)   Wt 94.3 kg (208 lb)   SpO2 100%   BMI 29.84 kg/m   Physical Exam  Constitutional: He is oriented to person, place, and time.  Uncomfortable, writhing in pain   HENT:  Head: Normocephalic.  Mouth/Throat: Oropharynx is clear and moist.  Eyes: Pupils are equal, round, and reactive to light.  Neck: Normal range of motion. Neck supple.  Cardiovascular: Normal rate, regular rhythm and normal heart sounds.  Pulmonary/Chest: Effort normal and breath sounds normal. No stridor. No respiratory distress.  Abdominal:  Soft.  + suprapubic tenderness. Foley with bloody urine   Genitourinary:  Genitourinary Comments: Foley with bloody urine   Musculoskeletal: Normal range of motion.  Neurological: He is alert and oriented to person, place, and time.  Skin: Skin is warm.  Psychiatric: He has a normal mood and affect.  Nursing note and vitals reviewed.    ED Treatments / Results  Labs (all labs ordered are listed, but only abnormal results are displayed) Labs Reviewed  CBC WITH DIFFERENTIAL/PLATELET - Abnormal; Notable for the following components:      Result Value   WBC 11.1 (*)    Neutro Abs 8.1 (*)    All other components within normal limits  BASIC METABOLIC PANEL  PROTIME-INR  APTT  HEPATIC FUNCTION PANEL  URINALYSIS, ROUTINE W REFLEX MICROSCOPIC    EKG None  Radiology No results found.  Procedures Procedures (including critical care time)  Foley irrigation I irrigated foley with 200 cc NS. Large clots came out of the foley.   Medications Ordered in ED Medications  sodium chloride 0.9 % bolus 1,000 mL (1,000 mLs Intravenous New Bag/Given 02/25/18 1837)  morphine 4 MG/ML injection 4 mg (has no administration in time range)  morphine 4 MG/ML injection 4 mg (4 mg Intravenous Given 02/25/18 1837)     Initial Impression / Assessment and Plan / ED Course  I have reviewed the triage vital signs and the nursing notes.  Pertinent labs & imaging results that were available during my care of the patient were reviewed by me and considered in my medical decision making (see chart for details).     Barry Carpenter is a 77 y.o. male on plavix here with hematuria. Patient has 3 way foley placed by outside hospital. I irrigated with 200 cc NS and large clots came out. Bedside US showed large clots in the bladder and foley appears to be in the bladder. I talked to Dr. Annabell HowellsWrenn from urology. Given that he still has large clots, he will take patient to the OR for wash out. Since patient has hx of DM, on  plavix for stroke, HTN, he request medicine admission. Hospitalist to admit. Will keep NPO.   Final Clinical Impressions(s) / ED Diagnoses   Final diagnoses:  None    ED Discharge Orders    None       Charlynne PanderYao,  Hsienta, MD 02/25/18 1910

## 2018-02-25 NOTE — ED Notes (Signed)
ED TO INPATIENT HANDOFF REPORT  Name/Age/Gender Barry Carpenter 77 y.o. male  Code Status   Home/SNF/Other Home  Chief Complaint sent by Dr.; Penile bleeding  Level of Care/Admitting Diagnosis ED Disposition    ED Disposition Condition Comment   Admit  [facility]ospital [facility] [[id number]]  Level of Care: Telemetry [5]  Admit to tele based on following criteria: Monitor for Ischemic changes  Diagnosis: Gross hematuria [599.71.ICD-9-CM]  Admitting Physician: Pearson GrippeKIM, JAMES [[id number]]  Attending Physician: Pearson GrippeKIM, JAMES [id number]]  Estimated length of stay: past midnight tomorrow  Certification:: I certify this patient will need inpatient services for at least 2 midnights  PT Class (Do Not Modify): Inpatient [101]  PT Acc Code (Do Not Modify): Private [1]       Medical History Past Medical History:  Diagnosis Date  . Cystic dysplasia of one kidney   . Diabetes mellitus without complication (HCC)   . Gout   . History of kidney stones   . Hypertension     Allergies No Known Allergies  IV Location/Drains/Wounds Patient Lines/Drains/Airways Status   Active Line/Drains/Airways    Name:   Placement date:   Placement time:   Site:   Days:   Peripheral IV 02/25/18 Left Hand   02/25/18    -    Hand   less than 1   Incision (Closed) 02/15/18 Perineum Other (Comment)   02/15/18    0746     10          Labs/Imaging Results for orders placed or performed during the hospital encounter of 02/25/18 (from the past 48 hour(s))  CBC with Differential/Platelet     Status: Abnormal   Collection Time: 02/25/18  6:38 PM  Result Value Ref Range   WBC 11.1 (H) 4.0 - 10.5 K/uL   RBC 4.84 4.22 - 5.81 MIL/uL   Hemoglobin 16.2 13.0 - 17.0 g/dL   HCT 96.047.0 45.439.0 - 09.852.0 %   MCV 97.1 78.0 - 100.0 fL   MCH 33.5 26.0 - 34.0 pg   MCHC 34.5 30.0 - 36.0 g/dL   RDW 11.914.9 14.711.5 - 82.915.5 %   Platelets 186 150 - 400 K/uL   Neutrophils Relative % 74 %   Neutro Abs 8.1 (H) 1.7 - 7.7 K/uL   Lymphocytes Relative 21 %   Lymphs Abs 2.4 0.7 - 4.0 K/uL   Monocytes Relative 5 %   Monocytes Absolute 0.5 0.1 - 1.0 K/uL   Eosinophils Relative 0 %   Eosinophils Absolute 0.0 0.0 - 0.7 K/uL   Basophils Relative 0 %   Basophils Absolute 0.0 0.0 - 0.1 K/uL    Comment: Performed at [facility], [address] [place], [place] [address]   No results found.  Pending Labs Unresulted Labs (From admission, onward)   Start     Ordered   02/25/18 1905  Protime-INR  Once,   R     02/25/18 1904   02/25/18 1905  APTT  Once,   R     02/25/18 1904   02/25/18 1905  Hepatic function panel  Once,   R     02/25/18 1904   02/25/18 1905  Urinalysis, Routine w reflex microscopic  Once,   R     02/25/18 1904   02/25/18 1823  Basic metabolic panel  STAT,   STAT     02/25/18 1823      Vitals/Pain Today's Vitals   02/25/18 1824 02/25/18 1839 02/25/18 1848 02/25/18 1900  BP:  (!) 172/66  (!) 175/109  Pulse:  (!) 104  (!) 108  Resp:  14  14  Temp:      TempSrc:      SpO2:  99%  100%  Weight: 208 lb (94.3 kg)     Height: 5\' 10"  (1.778 m)     PainSc: 10-Worst pain ever  8      Isolation Precautions No active isolations  Medications Medications  morphine 4 MG/ML injection 4 mg (4 mg Intravenous Given 02/25/18 1837)  sodium chloride 0.9 % bolus 1,000 mL (1,000 mLs Intravenous New Bag/Given 02/25/18 1837)  morphine 4 MG/ML injection 4 mg (4 mg Intravenous Given 02/25/18 1910)

## 2018-02-25 NOTE — ED Notes (Addendum)
Attempted to call report. Nurse stated she would call back when ready for report.

## 2018-02-25 NOTE — ED Notes (Addendum)
First attempt to call report to 6E RN Sharyl NimrodMeredith made. No answer.

## 2018-02-26 ENCOUNTER — Other Ambulatory Visit: Payer: Self-pay

## 2018-02-26 DIAGNOSIS — Z7901 Long term (current) use of anticoagulants: Secondary | ICD-10-CM | POA: Diagnosis not present

## 2018-02-26 DIAGNOSIS — N3289 Other specified disorders of bladder: Secondary | ICD-10-CM | POA: Diagnosis not present

## 2018-02-26 DIAGNOSIS — N99820 Postprocedural hemorrhage and hematoma of a genitourinary system organ or structure following a genitourinary system procedure: Secondary | ICD-10-CM | POA: Diagnosis not present

## 2018-02-26 DIAGNOSIS — R310 Gross hematuria: Secondary | ICD-10-CM | POA: Diagnosis not present

## 2018-02-26 LAB — BASIC METABOLIC PANEL WITH GFR
Anion gap: 10 (ref 5–15)
BUN: 24 mg/dL — ABNORMAL HIGH (ref 8–23)
CO2: 24 mmol/L (ref 22–32)
Calcium: 8.2 mg/dL — ABNORMAL LOW (ref 8.9–10.3)
Chloride: 107 mmol/L (ref 98–111)
Creatinine, Ser: 1.36 mg/dL — ABNORMAL HIGH (ref 0.61–1.24)
GFR calc Af Amer: 58 mL/min — ABNORMAL LOW
GFR calc non Af Amer: 50 mL/min — ABNORMAL LOW
Glucose, Bld: 116 mg/dL — ABNORMAL HIGH (ref 70–99)
Potassium: 3.5 mmol/L (ref 3.5–5.1)
Sodium: 141 mmol/L (ref 135–145)

## 2018-02-26 LAB — BASIC METABOLIC PANEL
ANION GAP: 14 (ref 5–15)
BUN: 30 mg/dL — ABNORMAL HIGH (ref 8–23)
CHLORIDE: 103 mmol/L (ref 98–111)
CO2: 18 mmol/L — AB (ref 22–32)
Calcium: 8.3 mg/dL — ABNORMAL LOW (ref 8.9–10.3)
Creatinine, Ser: 2.19 mg/dL — ABNORMAL HIGH (ref 0.61–1.24)
GFR calc Af Amer: 32 mL/min — ABNORMAL LOW (ref >60)
GFR calc non Af Amer: 28 mL/min — ABNORMAL LOW (ref >60)
GLUCOSE: 139 mg/dL — AB (ref 70–99)
POTASSIUM: 9.6 mmol/L — AB (ref 3.5–5.1)
Sodium: 135 mmol/L (ref 135–145)

## 2018-02-26 LAB — HEMOGLOBIN AND HEMATOCRIT, BLOOD
HCT: 42 % (ref 39.0–52.0)
HEMOGLOBIN: 14.3 g/dL (ref 13.0–17.0)

## 2018-02-26 LAB — GLUCOSE, CAPILLARY
GLUCOSE-CAPILLARY: 118 mg/dL — AB (ref 70–99)
Glucose-Capillary: 106 mg/dL — ABNORMAL HIGH (ref 70–99)

## 2018-02-26 MED ORDER — MENTHOL 3 MG MT LOZG
1.0000 | LOZENGE | OROMUCOSAL | Status: DC | PRN
  Administered 2018-02-26 (×2): 3 mg via ORAL
  Filled 2018-02-26: qty 9

## 2018-02-26 NOTE — Progress Notes (Signed)
  Subjective: Patient reports feeling fine  Objective: Vital signs in last 24 hours: Temp:  [97.6 F (36.4 C)-98 F (36.7 C)] 98 F (36.7 C) (07/22 0657) Pulse Rate:  [89-128] 89 (07/22 1001) Resp:  [14-20] 20 (07/22 1001) BP: (122-175)/(66-109) 122/75 (07/22 1001) SpO2:  [93 %-100 %] 93 % (07/22 1001) Weight:  [94.3 kg (208 lb)] 94.3 kg (208 lb) (07/21 1824)  Intake/Output from previous day: 07/21 0701 - 07/22 0700 In: 1646.3 [I.V.:486.3; IV Piggyback:1100] Out: 1000 [Urine:1000] Intake/Output this shift: Total I/O In: 300 [I.V.:300] Out: 275 [Urine:275]  Physical Exam:  Constitutional: Vital signs reviewed. WD WN in NAD   Eyes: PERRL, No scleral icterus.   Cardiovascular: RRR Pulmonary/Chest: Normal effort Abdominal: Soft. Non-tender, non-distended, bowel sounds are normal, no masses, organomegaly, or guarding present.  Extremities: No cyanosis or edema   Lab Results: Recent Labs    02/25/18 1838 02/26/18 0440  HGB 16.2 14.3  HCT 47.0 42.0   BMET Recent Labs    02/25/18 2146 02/26/18 0440  NA 141 141  K 3.5 3.5  CL 106 107  CO2 20* 24  GLUCOSE 124* 116*  BUN 28* 24*  CREATININE 1.61* 1.36*  CALCIUM 8.3* 8.2*   Recent Labs    02/25/18 2146  INR 1.04   No results for input(s): LABURIN in the last 72 hours. No results found for this or any previous visit.  Studies/Results: No results found.   Bladder irrigated some small clots evacuated. Catheter removed  Assessment/Plan:   Doing well w/ resolution of bleeding.  Will d/c pt if he voids following catheter removal   LOS: 1 day   Chelsea AusStephen M Daven Montz 02/26/2018, 10:36 AM

## 2018-02-26 NOTE — Plan of Care (Signed)
Plan of care discussed.   

## 2018-02-26 NOTE — Discharge Instructions (Signed)
1.  Expect to see some bloody urethral drainage as well as blood in the initial part of your urinary stream for a few days  2.  If you are on a medicine for your prostate i.e. tamsulosin, alfuzosin, doxazosin or terazosin, it is okay to stop that 2-3 days after your procedure  3.  If you are on finasteride, it is okay to stop that immediately  4.  Limit your exertional activity for approximately 2 weeks after the procedure.  If you are having no bloody drainage or pain at that point, you may liberalize your activities  5.  Keep your follow-up appointment that is scheduled.  If you have problems before the appointment such as continued large clots in your urine, difficulty urinating or fever, contact us before at [phone]  

## 2018-02-26 NOTE — Progress Notes (Signed)
02/26/18  1511  Reviewed discharge instructions with patient and his wife. Both verbalized understanding of discharge instructions. Copy of discharge instructions given to patient.

## 2018-02-26 NOTE — Plan of Care (Signed)
  Problem: Nutrition: Goal: Adequate nutrition will be maintained Outcome: Progressing   Problem: Pain Managment: Goal: General experience of comfort will improve Outcome: Progressing   Problem: Safety: Goal: Ability to remain free from injury will improve Outcome: Progressing   

## 2018-03-14 NOTE — Discharge Summary (Signed)
Patient ID: Bonnye Favacey Ponzo MRN: [id number] DOB/AGE: [birth year] 75 y.o.  Admit date: 02/25/2018 Discharge date: 03/14/2018  Primary Care Physician:  Toma DeitersHasanaj, Xaje A, MD  Discharge Diagnoses:  Gross hematuria, BPH with obstruction, clot retention Present on Admission: . Gross hematuria . Clot retention of urine . Essential hypertension   Consults:  Internal medicine   Discharge Medications: Allergies as of 02/26/2018   No Known Allergies     Medication List    STOP taking these medications   clopidogrel 75 MG tablet Commonly known as:  PLAVIX   oxybutynin 5 MG tablet Commonly known as:  DITROPAN   tamsulosin 0.4 MG Caps capsule Commonly known as:  FLOMAX     TAKE these medications   allopurinol 300 MG tablet Commonly known as:  ZYLOPRIM Take 300 mg by mouth daily.   amLODipine 10 MG tablet Commonly known as:  NORVASC Take 10 mg by mouth daily.   cloNIDine 0.2 MG tablet Commonly known as:  CATAPRES Take 0.2 mg by mouth 2 (two) times daily.   JANUVIA 100 MG tablet Generic drug:  sitaGLIPtin Take 100 mg by mouth daily.   KLOR-CON M10 10 MEQ tablet Generic drug:  potassium chloride Take 10 mEq by mouth daily.   lisinopril-hydrochlorothiazide 20-25 MG tablet Commonly known as:  PRINZIDE,ZESTORETIC Take 1 tablet by mouth daily.   pravastatin 40 MG tablet Commonly known as:  PRAVACHOL Take 40 mg by mouth daily.        Significant Diagnostic Studies:  No results found.  Brief H and P: For complete details please refer to admission H and P, but in brief the patient was admitted for clot retention/hematuria 2 weeks after urologic procedure.  He did not experience bleeding until shortly before his hospitalization.  Hospital Course:  The patient was admitted following emergency room evaluation/managementcatheter placement.  He was taken off blood thinners.  Following this, his urine cleared after appropriate irritation.  Following catheter removal, he voided and  was discharged from the hospital. Principal Problem:   Clot retention of urine Active Problems:   Gross hematuria   Essential hypertension   Diabetes (HCC)   Day of Discharge BP 122/75 (BP Location: Right Arm)   Pulse 89   Temp 98 F (36.7 C)   Resp 20   Ht 5\' 10"  (1.778 m)   Wt 94.3 kg (208 lb)   SpO2 93%   BMI 29.84 kg/m   No results found for this or any previous visit (from the past 24 hour(s)).  Physical Exam: General: Alert and awake oriented x3 not in any acute distress. HEENT: anicteric sclera, pupils reactive to light and accommodation CVS: S1-S2 clear no murmur rubs or gallops Chest: clear to auscultation bilaterally, no wheezing rales or rhonchi Abdomen: soft nontender, nondistended, normal bowel sounds, no organomegaly Extremities: no cyanosis, clubbing or edema noted bilaterally Neuro: Cranial nerves II-XII intact, no focal neurological deficits  Disposition:  home  Diet:  regular  Activity:  Discussed with patient   Disposition and Follow-up:    Follow-up will be arranged   DISCHARGE FOLLOW-UP Follow-up Information    Marcine Matarahlstedt, Xavior Niazi, MD Follow up.   Specialty:  Urology Why:  keep scheduled follow Contact information: [address] [place] [address] [phone]           Time spent on Discharge:  15 minutes  Signed: Bertram MillardStephen M Lima Chillemi 03/14/2018, 1:20 PM

## 2018-04-03 ENCOUNTER — Ambulatory Visit (INDEPENDENT_AMBULATORY_CARE_PROVIDER_SITE_OTHER): Payer: Medicare HMO | Admitting: Urology

## 2018-04-03 DIAGNOSIS — N401 Enlarged prostate with lower urinary tract symptoms: Secondary | ICD-10-CM | POA: Diagnosis not present

## 2018-04-03 DIAGNOSIS — N5201 Erectile dysfunction due to arterial insufficiency: Secondary | ICD-10-CM

## 2018-04-03 DIAGNOSIS — R351 Nocturia: Secondary | ICD-10-CM

## 2018-04-19 DIAGNOSIS — E7849 Other hyperlipidemia: Secondary | ICD-10-CM | POA: Diagnosis not present

## 2018-04-19 DIAGNOSIS — Z6832 Body mass index (BMI) 32.0-32.9, adult: Secondary | ICD-10-CM | POA: Diagnosis not present

## 2018-04-19 DIAGNOSIS — Z1389 Encounter for screening for other disorder: Secondary | ICD-10-CM | POA: Diagnosis not present

## 2018-04-19 DIAGNOSIS — I6789 Other cerebrovascular disease: Secondary | ICD-10-CM | POA: Diagnosis not present

## 2018-04-19 DIAGNOSIS — N182 Chronic kidney disease, stage 2 (mild): Secondary | ICD-10-CM | POA: Diagnosis not present

## 2018-04-19 DIAGNOSIS — I10 Essential (primary) hypertension: Secondary | ICD-10-CM | POA: Diagnosis not present

## 2018-04-19 DIAGNOSIS — Z0000 Encounter for general adult medical examination without abnormal findings: Secondary | ICD-10-CM | POA: Diagnosis not present

## 2018-04-19 DIAGNOSIS — M1009 Idiopathic gout, multiple sites: Secondary | ICD-10-CM | POA: Diagnosis not present

## 2018-04-19 DIAGNOSIS — E1121 Type 2 diabetes mellitus with diabetic nephropathy: Secondary | ICD-10-CM | POA: Diagnosis not present

## 2018-09-18 DIAGNOSIS — E7849 Other hyperlipidemia: Secondary | ICD-10-CM | POA: Diagnosis not present

## 2018-09-18 DIAGNOSIS — M1009 Idiopathic gout, multiple sites: Secondary | ICD-10-CM | POA: Diagnosis not present

## 2018-09-18 DIAGNOSIS — N182 Chronic kidney disease, stage 2 (mild): Secondary | ICD-10-CM | POA: Diagnosis not present

## 2018-09-18 DIAGNOSIS — E1121 Type 2 diabetes mellitus with diabetic nephropathy: Secondary | ICD-10-CM | POA: Diagnosis not present

## 2018-09-18 DIAGNOSIS — I6789 Other cerebrovascular disease: Secondary | ICD-10-CM | POA: Diagnosis not present

## 2018-09-18 DIAGNOSIS — Z6832 Body mass index (BMI) 32.0-32.9, adult: Secondary | ICD-10-CM | POA: Diagnosis not present

## 2018-09-18 DIAGNOSIS — I10 Essential (primary) hypertension: Secondary | ICD-10-CM | POA: Diagnosis not present

## 2018-09-24 DIAGNOSIS — R69 Illness, unspecified: Secondary | ICD-10-CM | POA: Diagnosis not present

## 2018-10-23 DIAGNOSIS — R69 Illness, unspecified: Secondary | ICD-10-CM | POA: Diagnosis not present

## 2018-12-04 DIAGNOSIS — W458XXA Other foreign body or object entering through skin, initial encounter: Secondary | ICD-10-CM | POA: Diagnosis not present

## 2018-12-04 DIAGNOSIS — Z79899 Other long term (current) drug therapy: Secondary | ICD-10-CM | POA: Diagnosis not present

## 2018-12-04 DIAGNOSIS — I739 Peripheral vascular disease, unspecified: Secondary | ICD-10-CM | POA: Diagnosis not present

## 2018-12-04 DIAGNOSIS — I10 Essential (primary) hypertension: Secondary | ICD-10-CM | POA: Diagnosis not present

## 2018-12-04 DIAGNOSIS — Z720 Tobacco use: Secondary | ICD-10-CM | POA: Diagnosis not present

## 2018-12-04 DIAGNOSIS — S61214A Laceration without foreign body of right ring finger without damage to nail, initial encounter: Secondary | ICD-10-CM | POA: Diagnosis not present

## 2018-12-24 DIAGNOSIS — Z6832 Body mass index (BMI) 32.0-32.9, adult: Secondary | ICD-10-CM | POA: Diagnosis not present

## 2018-12-24 DIAGNOSIS — N182 Chronic kidney disease, stage 2 (mild): Secondary | ICD-10-CM | POA: Diagnosis not present

## 2018-12-24 DIAGNOSIS — E7849 Other hyperlipidemia: Secondary | ICD-10-CM | POA: Diagnosis not present

## 2018-12-24 DIAGNOSIS — E1121 Type 2 diabetes mellitus with diabetic nephropathy: Secondary | ICD-10-CM | POA: Diagnosis not present

## 2018-12-24 DIAGNOSIS — M1009 Idiopathic gout, multiple sites: Secondary | ICD-10-CM | POA: Diagnosis not present

## 2018-12-24 DIAGNOSIS — I10 Essential (primary) hypertension: Secondary | ICD-10-CM | POA: Diagnosis not present

## 2018-12-24 DIAGNOSIS — I6789 Other cerebrovascular disease: Secondary | ICD-10-CM | POA: Diagnosis not present

## 2018-12-28 DIAGNOSIS — R69 Illness, unspecified: Secondary | ICD-10-CM | POA: Diagnosis not present

## 2019-06-19 IMAGING — DX DG CERVICAL SPINE COMPLETE 4+V
5 series · 5 of 5 positions shown · non-contrast
Comparison: No recent.

CLINICAL DATA: Neck pain.  No known injury .

EXAM:
CERVICAL SPINE - COMPLETE 4+ VIEW

[c-spine lat]
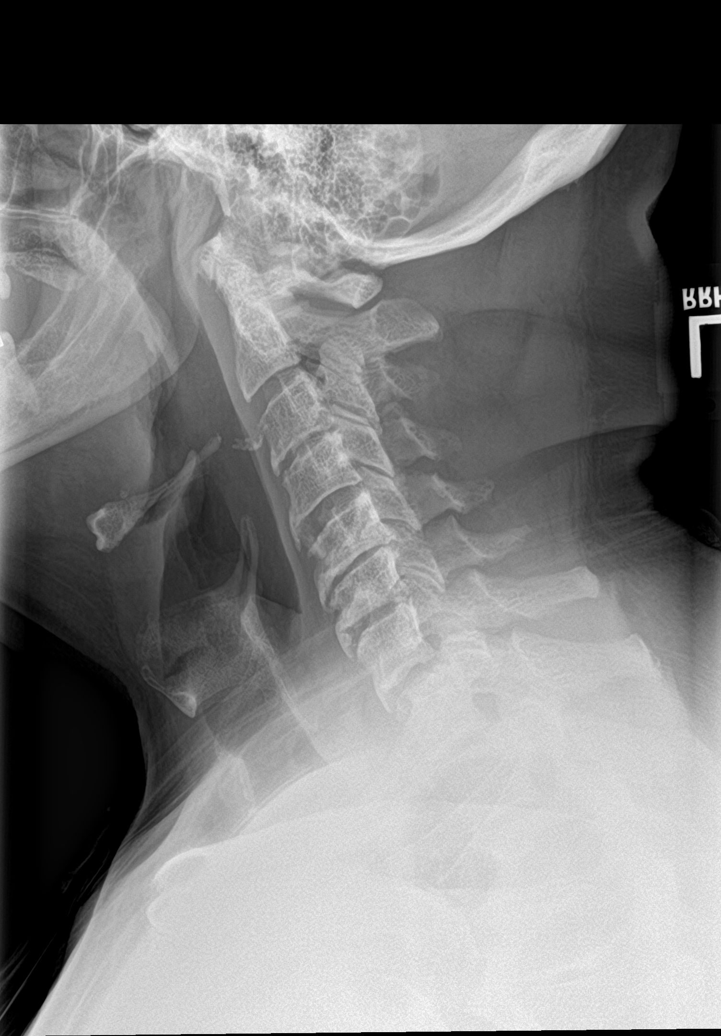

[c-spine obl (1 of 2)]
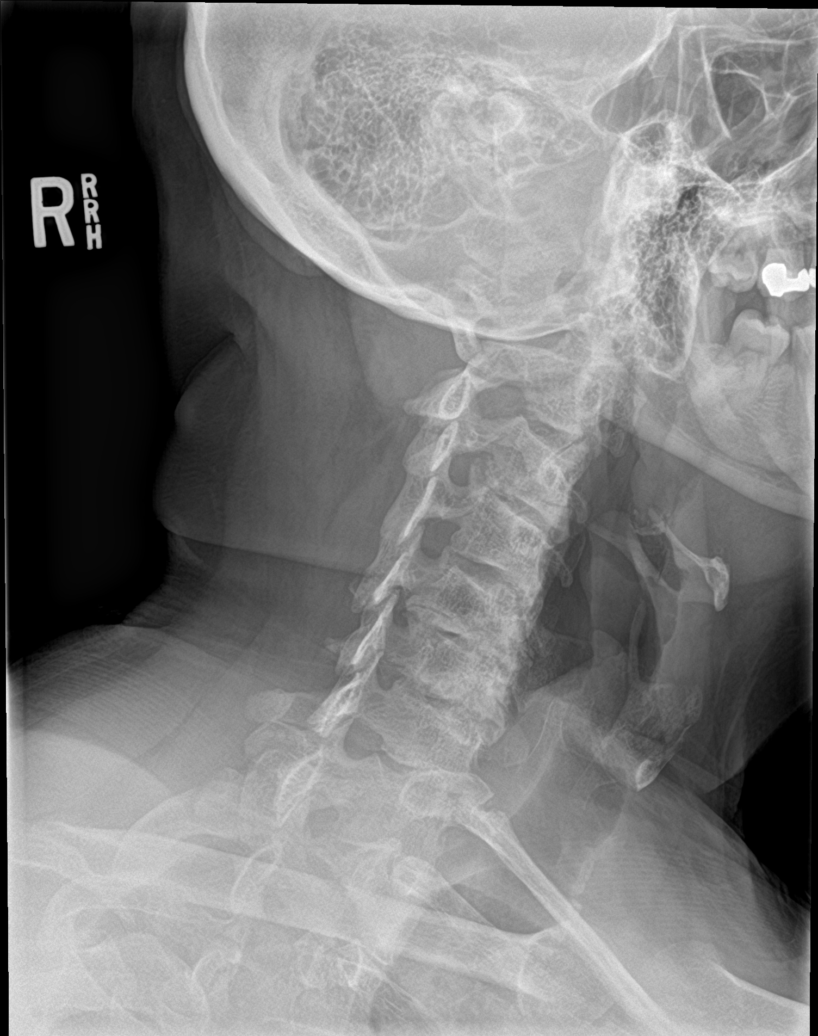

[c-spine obl (2 of 2)]
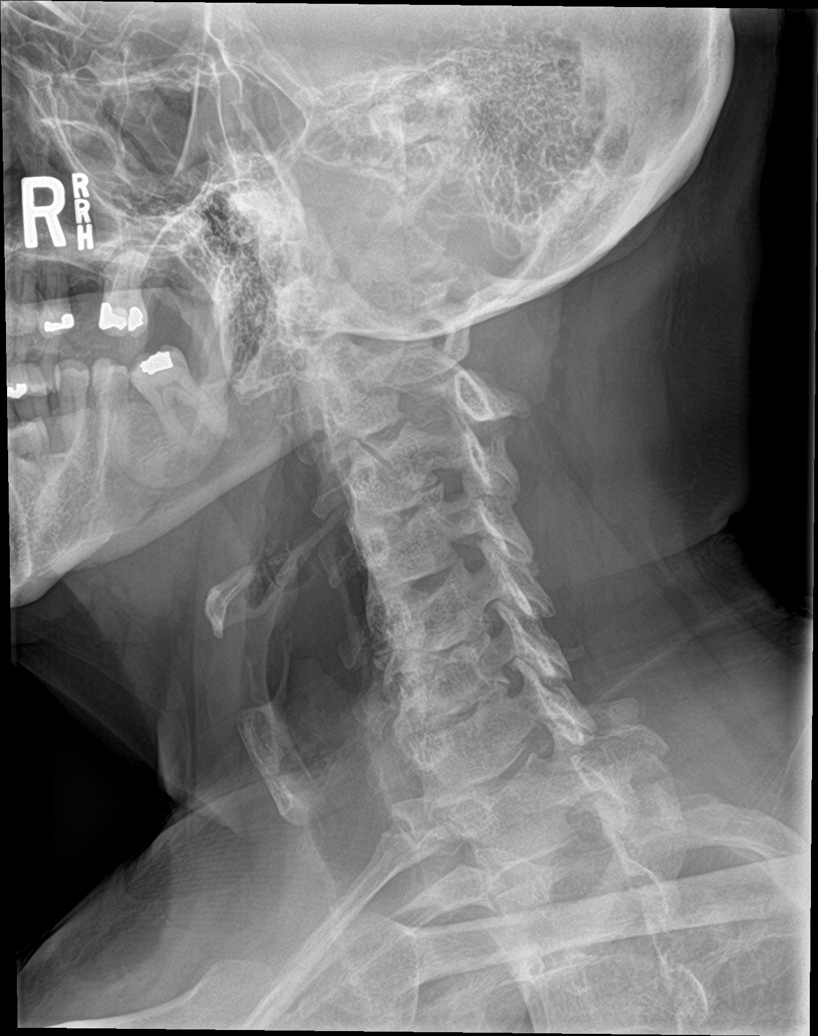

[c-spine ap]
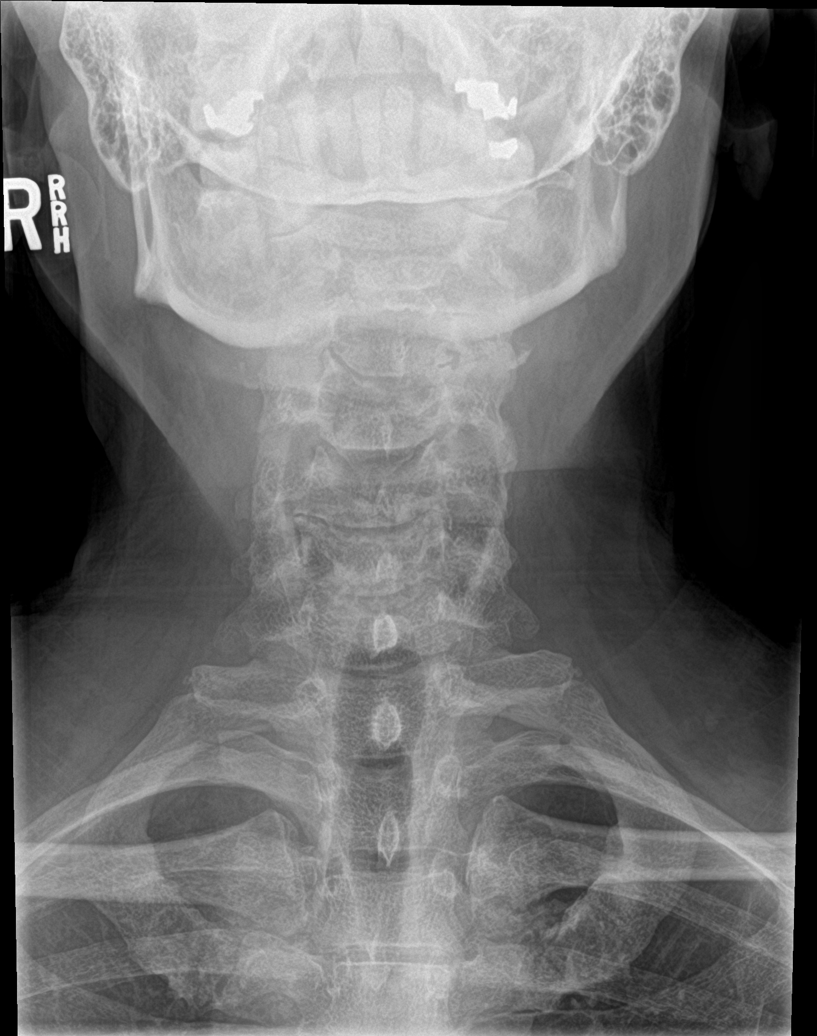

[c-spine open mouth]
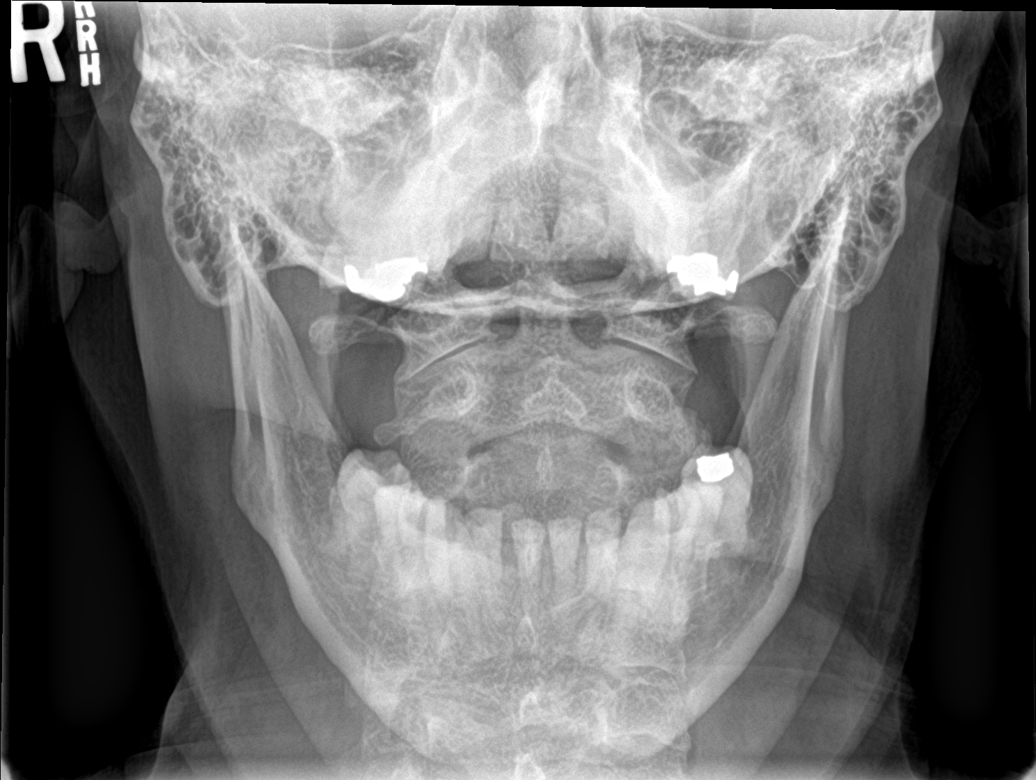

[5 of 5 positions shown; findings below may reference images not displayed]

FINDINGS: Diffuse multilevel degenerative change. No evidence of fracture or
dislocation. Multilevel mild neural foraminal narrowing. Pulmonary
apices are clear.
IMPRESSION: Diffuse multilevel degenerative change. Multilevel mild neural
foraminal narrowing. No acute bony abnormality .

## 2019-10-15 ENCOUNTER — Ambulatory Visit: Payer: Medicare HMO | Admitting: Urology

## 2019-11-26 ENCOUNTER — Ambulatory Visit: Payer: Medicare HMO | Admitting: Urology

## 2019-11-26 NOTE — Progress Notes (Incomplete)
H&P  Chief Complaint: BPH w/ LUTS  History of Present Illness:   4.20.2021:   (below copied from [facility] records):  BPH:   Barry Carpenter is a 79 year-old male established patient who is here for an enlarged prostate after surgical intervention.  He has had a Urolift for treatment of his lower urinary tract symptoms due to his BPH. It was performed 02/15/2018.   7.11.2019: He underwent Urolift procedure. He was admitted 2 weeks later for gross hematuria which resolved,   He is still on tamsulosin. IPSS 4 score 2.   Past Medical History:  Diagnosis Date  . CKD (chronic kidney disease)   . Cystic dysplasia of one kidney   . Diabetes mellitus without complication (HCC)   . Gout   . History of kidney stones   . Hypertension   . Stroke [facility])     Past Surgical History:  Procedure Laterality Date  . CYSTOSCOPY WITH INSERTION OF UROLIFT N/A 02/15/2018   Procedure: CYSTOSCOPY WITH INSERTION OF UROLIFT;  Surgeon: Marcine Matar, MD;  Location: [facility];  Service: Urology;  Laterality: N/A;  . LITHOTRIPSY      Home Medications:  Allergies as of 11/26/2019   No Known Allergies     Medication List       Accurate as of November 26, 2019  8:51 AM. If you have any questions, ask your nurse or doctor.        allopurinol 300 MG tablet Commonly known as: ZYLOPRIM Take 300 mg by mouth daily.   amLODipine 10 MG tablet Commonly known as: NORVASC Take 10 mg by mouth daily.   cloNIDine 0.2 MG tablet Commonly known as: CATAPRES Take 0.2 mg by mouth 2 (two) times daily.   Januvia 100 MG tablet Generic drug: sitaGLIPtin Take 100 mg by mouth daily.   Klor-Con M10 10 MEQ tablet Generic drug: potassium chloride Take 10 mEq by mouth daily.   lisinopril-hydrochlorothiazide 20-25 MG tablet Commonly known as: ZESTORETIC Take 1 tablet by mouth daily.   pravastatin 40 MG tablet Commonly known as: PRAVACHOL Take 40 mg by mouth daily.       Allergies: No Known Allergies  Family  History  Problem Relation Age of Onset  . Breast cancer Mother   . Melanoma Mother     Social History:  reports that he has never smoked. He has never used smokeless tobacco. He reports previous alcohol use. He reports that he does not use drugs.  ROS: A complete review of systems was performed.  All systems are negative except for pertinent findings as noted.  Physical Exam:  Vital signs in last 24 hours: There were no vitals taken for this visit. Constitutional:  Alert and oriented, No acute distress Cardiovascular: Regular rate  Respiratory: Normal respiratory effort GI: Abdomen is soft, nontender, nondistended, no abdominal masses. No CVAT.  Genitourinary: Normal male phallus, testes are descended bilaterally and non-tender and without masses, scrotum is normal in appearance without lesions or masses, perineum is normal on inspection. Lymphatic: No lymphadenopathy Neurologic: Grossly intact, no focal deficits Psychiatric: Normal mood and affect  Laboratory Data:  No results for input(s): WBC, HGB, HCT, PLT in the last 72 hours.  No results for input(s): NA, K, CL, GLUCOSE, BUN, CALCIUM, CREATININE in the last 72 hours.  Invalid input(s): CO3   No results found for this or any previous visit (from the past 24 hour(s)). No results found for this or any previous visit (from the past 240 hour(s)).  Renal Function:  No results for input(s): CREATININE in the last 168 hours. CrCl cannot be calculated (Patient's most recent lab result is older than the maximum 21 days allowed.).  Radiologic Imaging: No results found.  Impression/Assessment:  ***  Plan:  ***

## 2020-09-01 ENCOUNTER — Ambulatory Visit (INDEPENDENT_AMBULATORY_CARE_PROVIDER_SITE_OTHER): Payer: Medicare Other | Admitting: Urology

## 2020-09-01 ENCOUNTER — Other Ambulatory Visit: Payer: Self-pay

## 2020-09-01 ENCOUNTER — Encounter: Payer: Self-pay | Admitting: Urology

## 2020-09-01 VITALS — BP 146/90 | HR 108 | Temp 98.4°F

## 2020-09-01 DIAGNOSIS — R972 Elevated prostate specific antigen [PSA]: Secondary | ICD-10-CM | POA: Diagnosis not present

## 2020-09-01 DIAGNOSIS — R350 Frequency of micturition: Secondary | ICD-10-CM | POA: Diagnosis not present

## 2020-09-01 DIAGNOSIS — N401 Enlarged prostate with lower urinary tract symptoms: Secondary | ICD-10-CM

## 2020-09-01 DIAGNOSIS — R3915 Urgency of urination: Secondary | ICD-10-CM | POA: Diagnosis not present

## 2020-09-01 LAB — URINALYSIS, ROUTINE W REFLEX MICROSCOPIC
Bilirubin, UA: NEGATIVE
Glucose, UA: NEGATIVE
Ketones, UA: NEGATIVE
Leukocytes,UA: NEGATIVE
Nitrite, UA: NEGATIVE
Specific Gravity, UA: 1.015 (ref 1.005–1.030)
Urobilinogen, Ur: 0.2 mg/dL (ref 0.2–1.0)
pH, UA: 5 (ref 5.0–7.5)

## 2020-09-01 LAB — MICROSCOPIC EXAMINATION
Epithelial Cells (non renal): NONE SEEN /hpf (ref 0–10)
Renal Epithel, UA: NONE SEEN /hpf
WBC, UA: NONE SEEN /hpf (ref 0–5)

## 2020-09-01 LAB — BLADDER SCAN AMB NON-IMAGING: Scan Result: 18

## 2020-09-01 MED ORDER — SOLIFENACIN SUCCINATE 10 MG PO TABS: 10.0000 mg | ORAL_TABLET | Freq: Every day | ORAL | 11 refills | Status: DC

## 2020-09-01 NOTE — Progress Notes (Signed)
Urological Symptom Review  Patient is experiencing the following symptoms: Frequent urination Get up at night to urinate Stream starts and stops Erection problems (male only)   Review of Systems  Gastrointestinal (upper)  : Negative for upper GI symptoms  Gastrointestinal (lower) : Negative for lower GI symptoms  Constitutional : Negative for symptoms  Skin: Negative for skin symptoms  Eyes: Negative for eye symptoms  Ear/Nose/Throat : Negative for Ear/Nose/Throat symptoms  Hematologic/Lymphatic: Negative for Hematologic/Lymphatic symptoms  Cardiovascular : Negative for cardiovascular symptoms  Respiratory : Negative for respiratory symptoms  Endocrine: Negative for endocrine symptoms  Musculoskeletal: Negative for musculoskeletal symptoms  Neurological: Negative for neurological symptoms  Psychologic: Negative for psychiatric symptoms

## 2020-09-01 NOTE — Progress Notes (Signed)
H&P  Chief Complaint: Difficulty urinating, erectile dysfunction  History of Present Illness: 80 year old male presents today for follow-up.  He is followed in [facility] by Dr. Olena Leatherwood.  He underwent UroLift on February 15, 2018.  5 implants were placed.  He did experience postoperative bleeding 2 weeks later, with him having started back on Plavix.  Preoperative prostate volume was 24 mL.  He returns today because of urinary urgency, frequency, nocturia x4.  He feels like he has a good stream and he empties all the way.  He is no longer on tamsulosin.  He does not have urgency incontinence. Past Medical History:  Diagnosis Date  . CKD (chronic kidney disease)   . Cystic dysplasia of one kidney   . Diabetes mellitus without complication (HCC)   . Gout   . History of kidney stones   . Hypertension   . Stroke [facility])     Past Surgical History:  Procedure Laterality Date  . CYSTOSCOPY WITH INSERTION OF UROLIFT N/A 02/15/2018   Procedure: CYSTOSCOPY WITH INSERTION OF UROLIFT;  Surgeon: Marcine Matar, MD;  Location: [facility];  Service: Urology;  Laterality: N/A;  . LITHOTRIPSY      Home Medications:  Allergies as of 09/01/2020   No Known Allergies     Medication List       Accurate as of September 01, 2020 10:55 AM. If you have any questions, ask your nurse or doctor.        STOP taking these medications   amLODipine 10 MG tablet Commonly known as: NORVASC Stopped by: Chelsea Aus, MD   Klor-Con M10 10 MEQ tablet Generic drug: potassium chloride Stopped by: Chelsea Aus, MD   lisinopril-hydrochlorothiazide 20-25 MG tablet Commonly known as: ZESTORETIC Stopped by: Chelsea Aus, MD     TAKE these medications   allopurinol 300 MG tablet Commonly known as: ZYLOPRIM Take 300 mg by mouth daily.   cloNIDine 0.2 MG tablet Commonly known as: CATAPRES Take 0.2 mg by mouth 2 (two) times daily.   clopidogrel 75 MG tablet Commonly known as: PLAVIX Take 75 mg  by mouth daily.   Januvia 100 MG tablet Generic drug: sitaGLIPtin Take 100 mg by mouth daily.   lisinopril 10 MG tablet Commonly known as: ZESTRIL Take 10 mg by mouth daily.   pravastatin 40 MG tablet Commonly known as: PRAVACHOL Take 40 mg by mouth daily.       Allergies: No Known Allergies  Family History  Problem Relation Age of Onset  . Breast cancer Mother   . Melanoma Mother     Social History:  reports that he has never smoked. He has never used smokeless tobacco. He reports previous alcohol use. He reports that he does not use drugs.  ROS: A complete review of systems was performed.  All systems are negative except for pertinent findings as noted.  Physical Exam:  Vital signs in last 24 hours: BP (!) 146/90   Pulse (!) 108   Temp 98.4 F (36.9 C)  Constitutional:  Alert and oriented, No acute distress Cardiovascular: Regular rate  Respiratory: Normal respiratory effort GI: Abdomen is soft, nontender, nondistended, no abdominal masses. No CVAT.  Obese, no inguinal hernias. Genitourinary: Normal male phallus, testes are descended bilaterally and non-tender and without masses, scrotum is normal in appearance without lesions or masses, perineum is normal on inspection. Rectal: Prostate 30 g, symmetrical, nonnodular, nontender. Lymphatic: No lymphadenopathy Neurologic: Grossly intact, no focal deficits Psychiatric: Normal mood and affect  I  have reviewed prior pt notes  I have reviewed notes from referring/previous physicians  I have reviewed urinalysis results  I have independently reviewed prior imaging--minimal residual urine volume today.    Impression/Assessment:  1.  BPH, status post UroLift procedure in July 2019.  He seems to be emptying out well.  2.  Urinary frequency, urgency-nocturia.  Perhaps related to overactive bladder  Plan:  1.  I gave him an overactive bladder guide sheet  2.  I will send in a prescription for solifenacin 10 mg a  day  3.  I will see him back in about 6 weeks to recheck symptoms, IPSS, and possible cystoscopy.

## 2020-10-12 NOTE — Progress Notes (Signed)
History of Present Illness:    He underwent UroLift on February 15, 2018.  5 implants were placed.  He did experience postoperative bleeding 2 weeks later, with him having started back on Plavix.  Preoperative prostate volume was 24 mL.  3.8.2022: Biggest issues--nocturia x 3, frequency.  He takes solifenacin 10 mg a day.  Some dry mouth and constipation.  He would like to get a refill of Viagra.  IPSS Questionnaire (AUA-7): Over the past month.   1)  How often have you had a sensation of not emptying your bladder completely after you finish urinating?  2 - Less than half the time  2)  How often have you had to urinate again less than two hours after you finished urinating? 2 - Less than half the time  3)  How often have you found you stopped and started again several times when you urinated?  1 - Less than 1 time in 5  4) How difficult have you found it to postpone urination?  0 - Not at all  5) How often have you had a weak urinary stream?  2 - Less than half the time  6) How often have you had to push or strain to begin urination?  0 - Not at all  7) How many times did you most typically get up to urinate from the time you went to bed until the time you got up in the morning?  3 - 3 times  Total score:  0-7 mildly symptomatic   8-19 moderately symptomatic   20-35 severely symptomatic   IPSS 10   Past Medical History:  Diagnosis Date  . CKD (chronic kidney disease)   . Cystic dysplasia of one kidney   . Diabetes mellitus without complication (HCC)   . Gout   . History of kidney stones   . Hypertension   . Stroke [facility])     Past Surgical History:  Procedure Laterality Date  . CYSTOSCOPY WITH INSERTION OF UROLIFT N/A 02/15/2018   Procedure: CYSTOSCOPY WITH INSERTION OF UROLIFT;  Surgeon: Marcine Matar, MD;  Location: [facility];  Service: Urology;  Laterality: N/A;  . LITHOTRIPSY      Home Medications:  Allergies as of 10/13/2020   No Known Allergies     Medication List        Accurate as of October 12, 2020  9:03 PM. If you have any questions, ask your nurse or doctor.        allopurinol 300 MG tablet Commonly known as: ZYLOPRIM Take 300 mg by mouth daily.   cloNIDine 0.2 MG tablet Commonly known as: CATAPRES Take 0.2 mg by mouth 2 (two) times daily.   clopidogrel 75 MG tablet Commonly known as: PLAVIX Take 75 mg by mouth daily.   Januvia 100 MG tablet Generic drug: sitaGLIPtin Take 100 mg by mouth daily.   lisinopril 10 MG tablet Commonly known as: ZESTRIL Take 10 mg by mouth daily.   pravastatin 40 MG tablet Commonly known as: PRAVACHOL Take 40 mg by mouth daily.   solifenacin 10 MG tablet Commonly known as: VESICARE Take 1 tablet (10 mg total) by mouth daily.       Allergies: No Known Allergies  Family History  Problem Relation Age of Onset  . Breast cancer Mother   . Melanoma Mother     Social History:  reports that he has never smoked. He has never used smokeless tobacco. He reports previous alcohol use. He reports that he does not  use drugs.  ROS: A complete review of systems was performed.  All systems are negative except for pertinent findings as noted.  Physical Exam:  Vital signs in last 24 hours: There were no vitals taken for this visit. Constitutional:  Alert and oriented, No acute distress Cardiovascular: Regular rate  Respiratory: Normal respiratory effort Genitourinary: Normal male phallus, testes are descended bilaterally and non-tender and without masses, scrotum is normal in appearance without lesions or masses, perineum is normal on inspection.  Prostate 50 g, symmetrical, nonnodular, nontender Neurologic: Grossly intact, no focal deficits Psychiatric: Normal mood and affect   I have reviewed prior pt notes  I have reviewed notes from referring/previous physicians  I have reviewed urinalysis results      Impression/Assessment:  1 BPH, status post UroLift with adequate response.  Not on any  medications.  2.  Urinary frequency, most likely due to overactive bladder, on solifenacin  3.  ED, patient requests medical therapy  Plan:  1.  Continue on solifenacin.  I sent in a prescription for sildenafil  2.  I will continue to see him annually.

## 2020-10-13 ENCOUNTER — Encounter: Payer: Self-pay | Admitting: Urology

## 2020-10-13 ENCOUNTER — Other Ambulatory Visit: Payer: Self-pay

## 2020-10-13 ENCOUNTER — Ambulatory Visit (INDEPENDENT_AMBULATORY_CARE_PROVIDER_SITE_OTHER): Payer: Medicare Other | Admitting: Urology

## 2020-10-13 VITALS — BP 136/79 | HR 89 | Temp 97.9°F

## 2020-10-13 DIAGNOSIS — N400 Enlarged prostate without lower urinary tract symptoms: Secondary | ICD-10-CM

## 2020-10-13 DIAGNOSIS — N5201 Erectile dysfunction due to arterial insufficiency: Secondary | ICD-10-CM | POA: Diagnosis not present

## 2020-10-13 DIAGNOSIS — R350 Frequency of micturition: Secondary | ICD-10-CM | POA: Diagnosis not present

## 2020-10-13 LAB — URINALYSIS, ROUTINE W REFLEX MICROSCOPIC
Bilirubin, UA: NEGATIVE
Glucose, UA: NEGATIVE
Ketones, UA: NEGATIVE
Leukocytes,UA: NEGATIVE
Nitrite, UA: NEGATIVE
Specific Gravity, UA: 1.02 (ref 1.005–1.030)
Urobilinogen, Ur: 0.2 mg/dL (ref 0.2–1.0)
pH, UA: 5.5 (ref 5.0–7.5)

## 2020-10-13 LAB — MICROSCOPIC EXAMINATION
Bacteria, UA: NONE SEEN
Epithelial Cells (non renal): NONE SEEN /hpf (ref 0–10)
Renal Epithel, UA: NONE SEEN /hpf
WBC, UA: NONE SEEN /hpf (ref 0–5)

## 2020-10-13 MED ORDER — SILDENAFIL CITRATE 100 MG PO TABS: ORAL_TABLET | ORAL | 99 refills | Status: AC

## 2020-10-13 NOTE — Progress Notes (Signed)
Urological Symptom Review  Patient is experiencing the following symptoms: Frequent urination Get up at night to urinate Stream starts and stops   Review of Systems  Gastrointestinal (upper)  : Negative for upper GI symptoms  Gastrointestinal (lower) : Constipation  Constitutional : Negative for symptoms  Skin: Negative for skin symptoms  Eyes: Negative for eye symptoms  Ear/Nose/Throat : Negative for Ear/Nose/Throat symptoms  Hematologic/Lymphatic: Negative for Hematologic/Lymphatic symptoms  Cardiovascular : Negative for cardiovascular symptoms  Respiratory : Negative for respiratory symptoms  Endocrine: Negative for endocrine symptoms  Musculoskeletal: Negative for musculoskeletal symptoms  Neurological: Negative for neurological symptoms  Psychologic: Negative for psychiatric symptoms

## 2021-10-12 ENCOUNTER — Ambulatory Visit: Payer: Medicare Other | Admitting: Urology

## 2021-10-12 NOTE — Progress Notes (Incomplete)
History of Present Illness:  ? ?He underwent UroLift on February 15, 2018.  5 implants were placed.  He did experience postoperative bleeding 2 weeks later, with him having started back on Plavix. ?Preoperative prostate volume was 24 mL. ?  ?3.7.2023: ?  ? ?Past Medical History:  ?Diagnosis Date  ? CKD (chronic kidney disease)   ? Cystic dysplasia of one kidney   ? Diabetes mellitus without complication ([place])   ? Gout   ? History of kidney stones   ? Hypertension   ? Stroke [facility])   ? ? ?Past Surgical History:  ?Procedure Laterality Date  ? CYSTOSCOPY WITH INSERTION OF UROLIFT N/A 02/15/2018  ? Procedure: CYSTOSCOPY WITH INSERTION OF UROLIFT;  Surgeon: Franchot Gallo, MD;  Location: [facility];  Service: Urology;  Laterality: N/A;  ? LITHOTRIPSY    ? ? ?Home Medications:  ?Allergies as of 10/12/2021   ?No Known Allergies ?  ? ?  ?Medication List  ?  ? ?  ? Accurate as of October 12, 2021  8:21 AM. If you have any questions, ask your nurse or doctor.  ?  ?  ? ?  ? ?allopurinol 300 MG tablet ?Commonly known as: ZYLOPRIM ?Take 300 mg by mouth daily. ?  ?cloNIDine 0.2 MG tablet ?Commonly known as: CATAPRES ?Take 0.2 mg by mouth 2 (two) times daily. ?  ?clopidogrel 75 MG tablet ?Commonly known as: PLAVIX ?Take 75 mg by mouth daily. ?  ?Januvia 100 MG tablet ?Generic drug: sitaGLIPtin ?Take 100 mg by mouth daily. ?  ?lisinopril 10 MG tablet ?Commonly known as: ZESTRIL ?Take 10 mg by mouth daily. ?  ?pravastatin 40 MG tablet ?Commonly known as: PRAVACHOL ?Take 40 mg by mouth daily. ?  ?sildenafil 100 MG tablet ?Commonly known as: VIAGRA ?1/2-1 tablet po prn ?  ?solifenacin 10 MG tablet ?Commonly known as: VESICARE ?Take 1 tablet (10 mg total) by mouth daily. ?  ? ?  ? ? ?Allergies: No Known Allergies ? ?Family History  ?Problem Relation Age of Onset  ? Breast cancer Mother   ? Melanoma Mother   ? ? ?Social History:  reports that he has never smoked. He has never used smokeless tobacco. He reports that he does not currently use  alcohol. He reports that he does not use drugs. ? ?ROS: ?A complete review of systems was performed.  All systems are negative except for pertinent findings as noted. ? ?Physical Exam:  ?Vital signs in last 24 hours: ?There were no vitals taken for this visit. ?Constitutional:  Alert and oriented, No acute distress ?Cardiovascular: Regular rate  ?Respiratory: Normal respiratory effort ?GI: Abdomen is soft, nontender, nondistended, no abdominal masses. No CVAT.  ?Genitourinary: Normal male phallus, testes are descended bilaterally and non-tender and without masses, scrotum is normal in appearance without lesions or masses, perineum is normal on inspection. ?Lymphatic: No lymphadenopathy ?Neurologic: Grossly intact, no focal deficits ?Psychiatric: Normal mood and affect ? ?I have reviewed prior pt notes ? ?I have reviewed notes from referring/previous physicians ? ?I have reviewed urinalysis results ? ?I have independently reviewed prior imaging ? ?I have reviewed prior PSA results ? ?I have reviewed prior urine culture ? ? ?Impression/Assessment:  ?*** ? ?Plan:  ?*** ? ? ?

## 2022-01-11 ENCOUNTER — Ambulatory Visit (INDEPENDENT_AMBULATORY_CARE_PROVIDER_SITE_OTHER): Payer: Medicare Other | Admitting: Urology

## 2022-01-11 VITALS — BP 150/89 | HR 85

## 2022-01-11 DIAGNOSIS — N400 Enlarged prostate without lower urinary tract symptoms: Secondary | ICD-10-CM

## 2022-01-11 DIAGNOSIS — N5201 Erectile dysfunction due to arterial insufficiency: Secondary | ICD-10-CM

## 2022-01-11 DIAGNOSIS — R350 Frequency of micturition: Secondary | ICD-10-CM

## 2022-01-11 LAB — URINALYSIS, ROUTINE W REFLEX MICROSCOPIC
Bilirubin, UA: NEGATIVE
Ketones, UA: NEGATIVE
Nitrite, UA: NEGATIVE
Specific Gravity, UA: 1.015 (ref 1.005–1.030)
Urobilinogen, Ur: 0.2 mg/dL (ref 0.2–1.0)
pH, UA: 6 (ref 5.0–7.5)

## 2022-01-11 LAB — MICROSCOPIC EXAMINATION
Renal Epithel, UA: NONE SEEN /hpf
WBC, UA: >30 /hpf — AB (ref 0–5)

## 2022-01-11 MED ORDER — SOLIFENACIN SUCCINATE 10 MG PO TABS: 10.0000 mg | ORAL_TABLET | Freq: Every day | ORAL | 3 refills | Status: AC

## 2022-01-11 NOTE — Progress Notes (Signed)
History of Present Illness: Here for follow-up of BPH, status post UroLift on 7.11.2019.  Preoperative prostate volume was 24 mL.  He comes in for his yearly visit.  Complaining significantly of urinary frequency, urgency but no urgency incontinence.  No gross hematuria or dysuria.  He is not on Solifenacin that he was on last year.  Past Medical History:  Diagnosis Date   CKD (chronic kidney disease)    Cystic dysplasia of one kidney    Diabetes mellitus without complication ([place])    Gout    History of kidney stones    Hypertension    Stroke [facility])     Past Surgical History:  Procedure Laterality Date   CYSTOSCOPY WITH INSERTION OF UROLIFT N/A 02/15/2018   Procedure: CYSTOSCOPY WITH INSERTION OF UROLIFT;  Surgeon: Franchot Gallo, MD;  Location: [facility];  Service: Urology;  Laterality: N/A;   LITHOTRIPSY      Home Medications:  Allergies as of 01/11/2022   No Known Allergies      Medication List        Accurate as of January 11, 2022  9:03 AM. If you have any questions, ask your nurse or doctor.          allopurinol 300 MG tablet Commonly known as: ZYLOPRIM Take 300 mg by mouth daily.   cloNIDine 0.2 MG tablet Commonly known as: CATAPRES Take 0.2 mg by mouth 2 (two) times daily.   clopidogrel 75 MG tablet Commonly known as: PLAVIX Take 75 mg by mouth daily.   Januvia 100 MG tablet Generic drug: sitaGLIPtin Take 100 mg by mouth daily.   lisinopril 10 MG tablet Commonly known as: ZESTRIL Take 10 mg by mouth daily.   pravastatin 40 MG tablet Commonly known as: PRAVACHOL Take 40 mg by mouth daily.   sildenafil 100 MG tablet Commonly known as: VIAGRA 1/2-1 tablet po prn   solifenacin 10 MG tablet Commonly known as: VESICARE Take 1 tablet (10 mg total) by mouth daily.        Allergies: No Known Allergies  Family History  Problem Relation Age of Onset   Breast cancer Mother    Melanoma Mother     Social History:  reports that he has never smoked.  He has never used smokeless tobacco. He reports that he does not currently use alcohol. He reports that he does not use drugs.  ROS: A complete review of systems was performed.  All systems are negative except for pertinent findings as noted.  Physical Exam:  Vital signs in last 24 hours: There were no vitals taken for this visit. Constitutional:  Alert and oriented, No acute distress Cardiovascular: Regular rate  Respiratory: Normal respiratory effort Neurologic: Grossly intact, no focal deficits Psychiatric: Normal mood and affect  I have reviewed prior pt notes  I have reviewed urinalysis results  I have independently reviewed bladder scan-postvoid 145 mL     Impression/Assessment:  1.  BPH, status post UroLift procedure 4 years ago.  Adequate stream but still with OAB symptoms  2.  OAB symptoms, not on any medical therapy  Plan:  1.  I will send him out with a new prescription for Solifenacin  2.  I will have him come back in 3 months for repeat symptom check  3.  Patient has stage III CKD-I reassured him that as long as he is followed for his renal function and has regular management of his medical conditions he has low likelihood of needing dialysis

## 2022-04-12 ENCOUNTER — Ambulatory Visit: Payer: Medicare Other | Admitting: Urology

## 2022-05-10 ENCOUNTER — Ambulatory Visit: Payer: Medicare Other | Admitting: Urology

## 2022-05-17 ENCOUNTER — Ambulatory Visit: Payer: Medicare Other | Admitting: Urology
